# Patient Record
Sex: Male | Born: 1965 | Race: White | Hispanic: No
Health system: Western US, Academic
[De-identification: ages and names within clinical notes are randomized; demographics above are authoritative.]

## PROBLEM LIST (undated history)

## (undated) DIAGNOSIS — S065XAA Traumatic subdural hemorrhage with loss of consciousness status unknown, initial encounter: Secondary | ICD-10-CM

## (undated) DIAGNOSIS — G9600 Cerebrospinal fluid leak, unspecified: Secondary | ICD-10-CM

## (undated) DIAGNOSIS — G039 Meningitis, unspecified: Secondary | ICD-10-CM

## (undated) DIAGNOSIS — S069XAA Unspecified intracranial injury with loss of consciousness status unknown, initial encounter: Secondary | ICD-10-CM

## (undated) DIAGNOSIS — S32810A Multiple fractures of pelvis with stable disruption of pelvic ring, initial encounter for closed fracture: Secondary | ICD-10-CM

## (undated) DIAGNOSIS — R0689 Other abnormalities of breathing: Secondary | ICD-10-CM

## (undated) DIAGNOSIS — S0291XA Unspecified fracture of skull, initial encounter for closed fracture: Secondary | ICD-10-CM

## (undated) DIAGNOSIS — J189 Pneumonia, unspecified organism: Secondary | ICD-10-CM

## (undated) DIAGNOSIS — S02401A Maxillary fracture, unspecified, initial encounter for closed fracture: Secondary | ICD-10-CM

## (undated) DIAGNOSIS — S02609A Fracture of mandible, unspecified, initial encounter for closed fracture: Secondary | ICD-10-CM

## (undated) HISTORY — DX: Rider (driver) (passenger) of other motorcycle injured in unspecified traffic accident, initial encounter: V29.99XA

## (undated) HISTORY — DX: Maxillary fracture, unspecified side, initial encounter for closed fracture: S02.401A

## (undated) HISTORY — DX: Cerebrospinal fluid leak, unspecified: G96.00

## (undated) HISTORY — DX: Other abnormalities of breathing: R06.89

## (undated) HISTORY — DX: Unspecified intracranial injury with loss of consciousness status unknown, initial encounter: S06.9XAA

## (undated) HISTORY — DX: Multiple fractures of pelvis with stable disruption of pelvic ring, initial encounter for closed fracture: S32.810A

## (undated) HISTORY — DX: Unspecified fracture of skull, initial encounter for closed fracture: S02.91XA

## (undated) HISTORY — DX: Fracture of mandible, unspecified, initial encounter for closed fracture: S02.609A

## (undated) HISTORY — DX: Pneumonia, unspecified organism: J18.9

## (undated) HISTORY — DX: Traumatic subdural hemorrhage with loss of consciousness status unknown, initial encounter: S06.5XAA

## (undated) HISTORY — DX: Meningitis, unspecified: G03.9

## (undated) MED ORDER — LAMOTRIGINE 200 MG OR TABS
ORAL_TABLET | ORAL | 0 refills | Status: AC
Start: 2019-11-17 — End: ?

## (undated) MED ORDER — LAMOTRIGINE 200 MG OR TABS
ORAL_TABLET | ORAL | 0 refills | Status: AC
Start: 2019-09-06 — End: ?

---

## 2014-12-17 ENCOUNTER — Other Ambulatory Visit: Payer: Self-pay | Admitting: Unknown Physician Specialty

## 2014-12-17 ENCOUNTER — Other Ambulatory Visit: Payer: Self-pay | Admitting: Emergency Medicine

## 2014-12-17 ENCOUNTER — Inpatient Hospital Stay (HOSPITAL_BASED_OUTPATIENT_CLINIC_OR_DEPARTMENT_OTHER)
Admission: AC | Admit: 2014-12-17 | Discharge: 2015-01-03 | DRG: 003 | Disposition: A | Discharge status: Skilled Nursing Facility | Payer: Commercial Managed Care - PPO | Attending: Anesthesiology | Admitting: Anesthesiology

## 2014-12-17 ENCOUNTER — Other Ambulatory Visit: Payer: Self-pay | Admitting: Student in an Organized Health Care Education/Training Program

## 2014-12-17 ENCOUNTER — Other Ambulatory Visit: Payer: Self-pay | Admitting: Surgery

## 2014-12-17 ENCOUNTER — Inpatient Hospital Stay (HOSPITAL_COMMUNITY): Payer: BLUE CROSS/BLUE SHIELD | Admitting: Surgical Critical Care

## 2014-12-17 DIAGNOSIS — E870 Hyperosmolality and hypernatremia: Secondary | ICD-10-CM | POA: Diagnosis present

## 2014-12-17 DIAGNOSIS — S3993XA Unspecified injury of pelvis, initial encounter: Secondary | ICD-10-CM

## 2014-12-17 DIAGNOSIS — I959 Hypotension, unspecified: Secondary | ICD-10-CM | POA: Diagnosis present

## 2014-12-17 DIAGNOSIS — S01119A Laceration without foreign body of unspecified eyelid and periocular area, initial encounter: Secondary | ICD-10-CM | POA: Diagnosis present

## 2014-12-17 DIAGNOSIS — Y92410 Unspecified street and highway as the place of occurrence of the external cause: Secondary | ICD-10-CM

## 2014-12-17 DIAGNOSIS — Z66 Do not resuscitate: Secondary | ICD-10-CM | POA: Diagnosis not present

## 2014-12-17 DIAGNOSIS — G9340 Encephalopathy, unspecified: Secondary | ICD-10-CM | POA: Diagnosis not present

## 2014-12-17 DIAGNOSIS — S066X9A Traumatic subarachnoid hemorrhage with loss of consciousness of unspecified duration, initial encounter: Secondary | ICD-10-CM

## 2014-12-17 DIAGNOSIS — R6510 Systemic inflammatory response syndrome (SIRS) of non-infectious origin without acute organ dysfunction: Secondary | ICD-10-CM

## 2014-12-17 DIAGNOSIS — S299XXA Unspecified injury of thorax, initial encounter: Secondary | ICD-10-CM

## 2014-12-17 DIAGNOSIS — S0292XA Unspecified fracture of facial bones, initial encounter for closed fracture: Secondary | ICD-10-CM

## 2014-12-17 DIAGNOSIS — Z452 Encounter for adjustment and management of vascular access device: Secondary | ICD-10-CM

## 2014-12-17 DIAGNOSIS — S32810A Multiple fractures of pelvis with stable disruption of pelvic ring, initial encounter for closed fracture: Secondary | ICD-10-CM | POA: Diagnosis present

## 2014-12-17 DIAGNOSIS — E872 Acidosis: Secondary | ICD-10-CM | POA: Diagnosis present

## 2014-12-17 DIAGNOSIS — S02402A Zygomatic fracture, unspecified, initial encounter for closed fracture: Secondary | ICD-10-CM | POA: Diagnosis present

## 2014-12-17 DIAGNOSIS — E876 Hypokalemia: Secondary | ICD-10-CM | POA: Diagnosis present

## 2014-12-17 DIAGNOSIS — S0266XA Fracture of symphysis of mandible, initial encounter for closed fracture: Secondary | ICD-10-CM

## 2014-12-17 DIAGNOSIS — J155 Pneumonia due to Escherichia coli: Secondary | ICD-10-CM | POA: Diagnosis not present

## 2014-12-17 DIAGNOSIS — S02609A Fracture of mandible, unspecified, initial encounter for closed fracture: Secondary | ICD-10-CM | POA: Diagnosis present

## 2014-12-17 DIAGNOSIS — D62 Acute posthemorrhagic anemia: Secondary | ICD-10-CM

## 2014-12-17 DIAGNOSIS — R130 Aphagia: Secondary | ICD-10-CM | POA: Diagnosis present

## 2014-12-17 DIAGNOSIS — R40243 Glasgow coma scale score 3-8: Secondary | ICD-10-CM | POA: Diagnosis present

## 2014-12-17 DIAGNOSIS — G960 Cerebrospinal fluid leak: Secondary | ICD-10-CM | POA: Diagnosis present

## 2014-12-17 DIAGNOSIS — S15091A Other specified injury of right carotid artery, initial encounter: Secondary | ICD-10-CM

## 2014-12-17 DIAGNOSIS — S028XXA Fractures of other specified skull and facial bones, initial encounter for closed fracture: Secondary | ICD-10-CM | POA: Diagnosis present

## 2014-12-17 DIAGNOSIS — G008 Other bacterial meningitis: Secondary | ICD-10-CM | POA: Diagnosis not present

## 2014-12-17 DIAGNOSIS — Z431 Encounter for attention to gastrostomy: Secondary | ICD-10-CM

## 2014-12-17 DIAGNOSIS — S0219XA Other fracture of base of skull, initial encounter for closed fracture: Secondary | ICD-10-CM

## 2014-12-17 DIAGNOSIS — S065X9A Traumatic subdural hemorrhage with loss of consciousness of unspecified duration, initial encounter: Principal | ICD-10-CM | POA: Diagnosis present

## 2014-12-17 DIAGNOSIS — S065X0A Traumatic subdural hemorrhage without loss of consciousness, initial encounter: Secondary | ICD-10-CM

## 2014-12-17 DIAGNOSIS — S062X9A Diffuse traumatic brain injury with loss of consciousness of unspecified duration, initial encounter: Secondary | ICD-10-CM | POA: Insufficient documentation

## 2014-12-17 DIAGNOSIS — S329XXA Fracture of unspecified parts of lumbosacral spine and pelvis, initial encounter for closed fracture: Secondary | ICD-10-CM | POA: Diagnosis present

## 2014-12-17 DIAGNOSIS — Z4682 Encounter for fitting and adjustment of non-vascular catheter: Secondary | ICD-10-CM

## 2014-12-17 DIAGNOSIS — J95851 Ventilator associated pneumonia: Secondary | ICD-10-CM | POA: Diagnosis not present

## 2014-12-17 DIAGNOSIS — S062XAA Diffuse traumatic brain injury with loss of consciousness status unknown, initial encounter: Secondary | ICD-10-CM | POA: Insufficient documentation

## 2014-12-17 DIAGNOSIS — R633 Feeding difficulties: Secondary | ICD-10-CM

## 2014-12-17 DIAGNOSIS — S0291XA Unspecified fracture of skull, initial encounter for closed fracture: Secondary | ICD-10-CM

## 2014-12-17 DIAGNOSIS — J9601 Acute respiratory failure with hypoxia: Secondary | ICD-10-CM | POA: Diagnosis present

## 2014-12-17 DIAGNOSIS — Z7982 Long term (current) use of aspirin: Secondary | ICD-10-CM

## 2014-12-17 DIAGNOSIS — S0921XA Traumatic rupture of right ear drum, initial encounter: Secondary | ICD-10-CM

## 2014-12-17 DIAGNOSIS — S02413A LeFort III fracture, initial encounter for closed fracture: Secondary | ICD-10-CM | POA: Diagnosis present

## 2014-12-17 DIAGNOSIS — S329XXB Fracture of unspecified parts of lumbosacral spine and pelvis, initial encounter for open fracture: Secondary | ICD-10-CM

## 2014-12-17 HISTORY — DX: Other fracture of base of skull, initial encounter for closed fracture: S02.19XA

## 2014-12-17 LAB — URINALYSIS WITH REFLEX CULTURE
Bilirubin (Qual), URN: NEGATIVE
Epith Cells_Renal/Trans,URN: NEGATIVE /HPF
Glucose Qual, URN: NEGATIVE mg/dL
Ketones, URN: NEGATIVE mg/dL
Leukocyte Esterase, URN: NEGATIVE
Nitrite, URN: NEGATIVE
Specific Gravity, URN: 1.027 g/mL (ref 1.002–1.027)

## 2014-12-17 LAB — PREPARE PLASMA: Units Ordered: 6

## 2014-12-17 LAB — TRAUMA HEMORRHAGE PANEL
Alcohol (Ethyl): 154 mg/dL — AB
Anion Gap: 11 (ref 4–12)
Calcium: 7 mg/dL — ABNORMAL LOW (ref 8.9–10.2)
Carbon Dioxide, Total: 21 meq/L — ABNORMAL LOW (ref 22–32)
Chloride: 109 meq/L — ABNORMAL HIGH (ref 98–108)
Creatinine: 1.1 mg/dL (ref 0.51–1.18)
Fibrinogen: 198 mg/dL (ref 150–450)
GFR, Calc, African American: >60 mL/min (ref >59)
GFR, Calc, European American: >60 mL/min (ref >59)
Glucose: 114 mg/dL (ref 62–125)
Hematocrit: 36 % — ABNORMAL LOW (ref 38–50)
Hemoglobin: 12.5 g/dL — ABNORMAL LOW (ref 13.0–18.0)
Lipase: 23 U/L (ref <70)
MCH: 29.8 pg (ref 27.3–33.6)
MCHC: 34.5 g/dL (ref 32.2–36.5)
MCV: 86 fL (ref 81–98)
Partial Thromboplastin Time: 29 s (ref 22–35)
Platelet Count: 158 10*3/uL (ref 150–400)
Potassium: 3.9 meq/L (ref 3.6–5.2)
Prothrombin INR: 1.3 (ref 0.8–1.3)
Prothrombin Time Patient: 15.6 s (ref 10.7–15.6)
RBC: 4.2 10*6/uL — ABNORMAL LOW (ref 4.40–5.60)
RDW-CV: 13.7 % (ref 11.6–14.4)
Sodium: 141 meq/L (ref 135–145)
Urea Nitrogen: 15 mg/dL (ref 8–21)
WBC: 7.41 10*3/uL (ref 4.30–10.00)

## 2014-12-17 LAB — MAGNESIUM
Magnesium: 1.4 mg/dL — ABNORMAL LOW (ref 1.8–2.4)
Magnesium: 1.6 mg/dL — ABNORMAL LOW (ref 1.8–2.4)

## 2014-12-17 LAB — CODE PANEL, ARTERIAL, W/ LACT, ICA, NA, K, GLU, HBCO, MET HGB
Base Deficit Blood, ART: 8.9 meq/L — ABNORMAL HIGH (ref 0.0–2.0)
Bicarbonate: 17 meq/L — ABNORMAL LOW (ref 22–26)
Calcium (Ionized): 0.88 mmol/L — ABNORMAL LOW (ref 1.18–1.38)
Carboxyhemoglobin, ART: 1.2 %
Glucose: 96 mg/dL (ref 62–125)
Hematocrit: 35 % — ABNORMAL LOW (ref 38–50)
Hemoglobin: 11.2 g/dL — ABNORMAL LOW (ref 13.0–18.0)
L Lactate (Direct), ART WB: 4.4 mmol/L — ABNORMAL HIGH (ref 0.4–1.0)
Methemoglobin, ART: 1 % (ref <3.0)
O2 Content: 15.3 VOL% (ref 15–23)
O2 Sat (Frac.), ART: 96 % (ref 94–99)
O2 Sat (Func.), ART: 98 %
Potassium: 3.2 meq/L — ABNORMAL LOW (ref 3.7–5.2)
Sodium: 143 meq/L (ref 136–145)
Total Hemoglobin, ART: 11.2 g/dL — ABNORMAL LOW (ref 13.0–18.0)
pCO2, ART: 35 mmHg (ref 33–48)
pH, ART: 7.29 — ABNORMAL LOW (ref 7.35–7.45)
pO2: 117 mmHg — ABNORMAL HIGH (ref 70–95)

## 2014-12-17 LAB — BLOOD GAS, ARTERIAL, W/ LACT
Base Deficit Blood, ART: 8.7 meq/L — ABNORMAL HIGH (ref 0.0–2.0)
Base Deficit Blood, ART: 6 meq/L — ABNORMAL HIGH (ref 0.0–2.0)
Base Deficit Blood, ART: 2 meq/L (ref 0.0–2.0)
Base Deficit Blood, ART: 2.9 meq/L — ABNORMAL HIGH (ref 0.0–2.0)
Bicarbonate: 16 meq/L — ABNORMAL LOW (ref 22–26)
Bicarbonate: 18 meq/L — ABNORMAL LOW (ref 22–26)
Bicarbonate: 21 meq/L — ABNORMAL LOW (ref 22–26)
Bicarbonate: 21 meq/L — ABNORMAL LOW (ref 22–26)
Fi (O2) [%]: 100 %
Fi (O2) [%]: 100 %
Fi (O2) [%]: 60 %
Fi (O2) [%]: 10 %
L Lactate (Direct), ART WB: 5.7 mmol/L — ABNORMAL HIGH (ref 0.4–1.0)
L Lactate (Direct), ART WB: 5.1 mmol/L — ABNORMAL HIGH (ref 0.4–1.0)
L Lactate (Direct), ART WB: 2.8 mmol/L — ABNORMAL HIGH (ref 0.4–1.0)
L Lactate (Direct), ART WB: 3.9 mmol/L — ABNORMAL HIGH (ref 0.4–1.0)
PEEP (Pos End Expir Pres): 8
PEEP (Pos End Expir Pres): 10
PEEP (Pos End Expir Pres): 10
PEEP (Pos End Expir Pres): 50
pCO2, ART: 31 mmHg — ABNORMAL LOW (ref 33–48)
pCO2, ART: 31 mmHg — ABNORMAL LOW (ref 33–48)
pCO2, ART: 32 mmHg — ABNORMAL LOW (ref 33–48)
pCO2, ART: 35 mmHg (ref 33–48)
pH, ART: 7.33 — ABNORMAL LOW (ref 7.35–7.45)
pH, ART: 7.38 (ref 7.35–7.45)
pH, ART: 7.44 (ref 7.35–7.45)
pH, ART: 7.4 (ref 7.35–7.45)
pO2: 94 mmHg (ref 70–95)
pO2: 246 mmHg — ABNORMAL HIGH (ref 70–95)
pO2: 198 mmHg — ABNORMAL HIGH (ref 70–95)
pO2: 200 mmHg — ABNORMAL HIGH (ref 70–95)

## 2014-12-17 LAB — CBC (HEMOGRAM)
Hematocrit: 31 % — ABNORMAL LOW (ref 38–50)
Hemoglobin: 10.2 g/dL — ABNORMAL LOW (ref 13.0–18.0)
MCH: 28.9 pg (ref 27.3–33.6)
MCHC: 33.1 g/dL (ref 32.2–36.5)
MCV: 87 fL (ref 81–98)
Platelet Count: 108 10*3/uL — ABNORMAL LOW (ref 150–400)
RBC: 3.53 10*6/uL — ABNORMAL LOW (ref 4.40–5.60)
RDW-CV: 13.8 % (ref 11.6–14.4)
WBC: 4.25 10*3/uL — ABNORMAL LOW (ref 4.30–10.00)

## 2014-12-17 LAB — EMERGENCY HEMORRHAGE PANEL
Fibrinogen: 243 mg/dL (ref 150–450)
Fibrinogen: 370 mg/dL (ref 150–450)
Hematocrit: 26 % — ABNORMAL LOW (ref 38–50)
Hematocrit: 28 % — ABNORMAL LOW (ref 38–50)
Platelet Count: 103 10*3/uL — ABNORMAL LOW (ref 150–400)
Platelet Count: 100 10*3/uL — ABNORMAL LOW (ref 150–400)
Prothrombin INR: 1.3 (ref 0.8–1.3)
Prothrombin INR: 1.3 (ref 0.8–1.3)
Prothrombin Time Patient: 15.8 s — ABNORMAL HIGH (ref 10.7–15.6)
Prothrombin Time Patient: 15.4 s (ref 10.7–15.6)

## 2014-12-17 LAB — PREPARE PLATELETS: Units Ordered: 1

## 2014-12-17 LAB — BASIC METABOLIC PANEL
Anion Gap: 10 (ref 4–12)
Calcium: 6.2 mg/dL — ABNORMAL LOW (ref 8.9–10.2)
Carbon Dioxide, Total: 17 meq/L — ABNORMAL LOW (ref 22–32)
Chloride: 114 meq/L — ABNORMAL HIGH (ref 98–108)
Creatinine: 1.05 mg/dL (ref 0.51–1.18)
GFR, Calc, African American: >60 mL/min (ref >59)
GFR, Calc, European American: >60 mL/min (ref >59)
Glucose: 91 mg/dL (ref 62–125)
Potassium: 3.5 meq/L — ABNORMAL LOW (ref 3.6–5.2)
Sodium: 141 meq/L (ref 135–145)
Urea Nitrogen: 14 mg/dL (ref 8–21)

## 2014-12-17 LAB — BLOOD TYPE CONFIRMATION: ABO/Rh: O POS

## 2014-12-17 LAB — PROTHROMBIN & PTT
Partial Thromboplastin Time: 32 s (ref 22–35)
Prothrombin INR: 1.4 — ABNORMAL HIGH (ref 0.8–1.3)
Prothrombin Time Patient: 17 s — ABNORMAL HIGH (ref 10.7–15.6)

## 2014-12-17 LAB — TRANSFUSION SERVICES TESTING

## 2014-12-17 LAB — TYPE AND CROSSMATCH
ABO/Rh: O POS
Antibody Screen: NEGATIVE
Units Ordered: 6

## 2014-12-17 LAB — PREPARE CRYOPRECIPITATE: Units Ordered: 1

## 2014-12-17 LAB — REFLEX CULTURE FOR UA

## 2014-12-17 LAB — PHOSPHATE
Phosphate: 1.7 mg/dL — ABNORMAL LOW (ref 2.5–4.5)
Phosphate: 2.5 mg/dL (ref 2.5–4.5)

## 2014-12-17 LAB — GLUCOSE POC, HMC: Glucose (POC): 137 mg/dL — ABNORMAL HIGH (ref 62–125)

## 2014-12-17 LAB — POTASSIUM, SERUM: Potassium: 4.1 meq/L (ref 3.6–5.2)

## 2014-12-17 LAB — CREATININE, WBLD: Creatinine: 0.92 mg/dL (ref 0.51–1.18)

## 2014-12-18 LAB — BLOOD GAS, ARTERIAL (NO ELECTROLYTES)
Base Deficit Blood, ART: 1.3 meq/L (ref 0.0–2.0)
Base Deficit Blood, ART: 0.9 meq/L (ref 0.0–2.0)
Bicarbonate: 23 meq/L (ref 22–26)
Bicarbonate: 23 meq/L (ref 22–26)
Fi (O2) [%]: 50 %
Fi (O2) [%]: 40 %
PEEP (Pos End Expir Pres): 8
PEEP (Pos End Expir Pres): 5
pCO2, ART: 37 mmHg (ref 33–48)
pCO2, ART: 35 mmHg (ref 33–48)
pH, ART: 7.41 (ref 7.35–7.45)
pH, ART: 7.43 (ref 7.35–7.45)
pO2: 170 mmHg — ABNORMAL HIGH (ref 70–95)
pO2: 137 mmHg — ABNORMAL HIGH (ref 70–95)

## 2014-12-18 LAB — PROTHROMBIN & PTT
Partial Thromboplastin Time: 31 s (ref 22–35)
Prothrombin INR: 1.5 — ABNORMAL HIGH (ref 0.8–1.3)
Prothrombin Time Patient: 17.7 s — ABNORMAL HIGH (ref 10.7–15.6)

## 2014-12-18 LAB — BLOOD GAS, ARTERIAL, W/ LACT
Base Deficit Blood, ART: 0.9 meq/L (ref 0.0–2.0)
Bicarbonate: 23 meq/L (ref 22–26)
Fi (O2) [%]: 40 %
L Lactate (Direct), ART WB: 1.7 mmol/L — ABNORMAL HIGH (ref 0.4–1.0)
PEEP (Pos End Expir Pres): 5
pCO2, ART: 38 mmHg (ref 33–48)
pH, ART: 7.4 (ref 7.35–7.45)
pO2: 119 mmHg — ABNORMAL HIGH (ref 70–95)

## 2014-12-18 LAB — BASIC METABOLIC PANEL
Anion Gap: 6 (ref 4–12)
Anion Gap: 4 (ref 4–12)
Calcium: 6.9 mg/dL — ABNORMAL LOW (ref 8.9–10.2)
Calcium: 7.5 mg/dL — ABNORMAL LOW (ref 8.9–10.2)
Carbon Dioxide, Total: 21 meq/L — ABNORMAL LOW (ref 22–32)
Carbon Dioxide, Total: 23 meq/L (ref 22–32)
Chloride: 115 meq/L — ABNORMAL HIGH (ref 98–108)
Chloride: 114 meq/L — ABNORMAL HIGH (ref 98–108)
Creatinine: 0.93 mg/dL (ref 0.51–1.18)
Creatinine: 0.79 mg/dL (ref 0.51–1.18)
GFR, Calc, African American: >60 mL/min (ref >59)
GFR, Calc, African American: >60 mL/min (ref >59)
GFR, Calc, European American: >60 mL/min (ref >59)
GFR, Calc, European American: >60 mL/min (ref >59)
Glucose: 140 mg/dL — ABNORMAL HIGH (ref 62–125)
Glucose: 128 mg/dL — ABNORMAL HIGH (ref 62–125)
Potassium: 3.9 meq/L (ref 3.6–5.2)
Potassium: 3.8 meq/L (ref 3.6–5.2)
Sodium: 142 meq/L (ref 135–145)
Sodium: 141 meq/L (ref 135–145)
Urea Nitrogen: 12 mg/dL (ref 8–21)
Urea Nitrogen: 12 mg/dL (ref 8–21)

## 2014-12-18 LAB — CBC (HEMOGRAM)
Hematocrit: 26 % — ABNORMAL LOW (ref 38–50)
Hemoglobin: 8.7 g/dL — ABNORMAL LOW (ref 13.0–18.0)
MCH: 29 pg (ref 27.3–33.6)
MCHC: 33.3 g/dL (ref 32.2–36.5)
MCV: 87 fL (ref 81–98)
Platelet Count: 103 10*3/uL — ABNORMAL LOW (ref 150–400)
RBC: 3 10*6/uL — ABNORMAL LOW (ref 4.40–5.60)
RDW-CV: 14.4 % (ref 11.6–14.4)
WBC: 12.51 10*3/uL — ABNORMAL HIGH (ref 4.30–10.00)

## 2014-12-18 LAB — GLUCOSE POC, HMC: Glucose (POC): 140 mg/dL — ABNORMAL HIGH (ref 62–125)

## 2014-12-18 LAB — PHOSPHATE
Phosphate: 2.2 mg/dL — ABNORMAL LOW (ref 2.5–4.5)
Phosphate: 1.8 mg/dL — ABNORMAL LOW (ref 2.5–4.5)

## 2014-12-18 LAB — HEMATOCRIT: Hematocrit: 24 % — ABNORMAL LOW (ref 38–50)

## 2014-12-18 LAB — URINE C/S: Culture: NO GROWTH

## 2014-12-18 LAB — R/O MRSA

## 2014-12-18 LAB — MAGNESIUM: Magnesium: 2 mg/dL (ref 1.8–2.4)

## 2014-12-18 LAB — CALCIUM (IONIZED), WB: Calcium (Ionized): 1.07 mmol/L — ABNORMAL LOW (ref 1.18–1.38)

## 2014-12-18 LAB — L_LACTATE, ARTERIAL WHOLE BLOOD: L Lactate (Direct), ART WB: 2.1 mmol/L — ABNORMAL HIGH (ref 0.4–1.0)

## 2014-12-19 ENCOUNTER — Other Ambulatory Visit: Payer: Self-pay | Admitting: Surgery

## 2014-12-19 ENCOUNTER — Other Ambulatory Visit: Payer: Self-pay | Admitting: General Practice

## 2014-12-19 ENCOUNTER — Other Ambulatory Visit: Payer: Self-pay | Admitting: Neurological Surgery

## 2014-12-19 ENCOUNTER — Other Ambulatory Visit: Payer: Self-pay | Admitting: Student in an Organized Health Care Education/Training Program

## 2014-12-19 DIAGNOSIS — S32811A Multiple fractures of pelvis with unstable disruption of pelvic ring, initial encounter for closed fracture: Secondary | ICD-10-CM

## 2014-12-19 DIAGNOSIS — S028XXA Fractures of other specified skull and facial bones, initial encounter for closed fracture: Secondary | ICD-10-CM

## 2014-12-19 DIAGNOSIS — S065X0A Traumatic subdural hemorrhage without loss of consciousness, initial encounter: Secondary | ICD-10-CM

## 2014-12-19 DIAGNOSIS — S06320A Contusion and laceration of left cerebrum without loss of consciousness, initial encounter: Secondary | ICD-10-CM

## 2014-12-19 DIAGNOSIS — S06310A Contusion and laceration of right cerebrum without loss of consciousness, initial encounter: Secondary | ICD-10-CM

## 2014-12-19 DIAGNOSIS — J9690 Respiratory failure, unspecified, unspecified whether with hypoxia or hypercapnia: Secondary | ICD-10-CM

## 2014-12-19 DIAGNOSIS — S3289XA Fracture of other parts of pelvis, initial encounter for closed fracture: Secondary | ICD-10-CM

## 2014-12-19 LAB — BASIC METABOLIC PANEL
Anion Gap: 2 — ABNORMAL LOW (ref 4–12)
Anion Gap: 3 — ABNORMAL LOW (ref 4–12)
Calcium: 7.7 mg/dL — ABNORMAL LOW (ref 8.9–10.2)
Calcium: 7.6 mg/dL — ABNORMAL LOW (ref 8.9–10.2)
Carbon Dioxide, Total: 26 meq/L (ref 22–32)
Carbon Dioxide, Total: 27 meq/L (ref 22–32)
Chloride: 118 meq/L — ABNORMAL HIGH (ref 98–108)
Chloride: 113 meq/L — ABNORMAL HIGH (ref 98–108)
Creatinine: 0.79 mg/dL (ref 0.51–1.18)
Creatinine: 0.68 mg/dL (ref 0.51–1.18)
GFR, Calc, African American: >60 mL/min (ref >59)
GFR, Calc, African American: >60 mL/min (ref >59)
GFR, Calc, European American: >60 mL/min (ref >59)
GFR, Calc, European American: >60 mL/min (ref >59)
Glucose: 118 mg/dL (ref 62–125)
Glucose: 102 mg/dL (ref 62–125)
Potassium: 3.8 meq/L (ref 3.6–5.2)
Potassium: 3.6 meq/L (ref 3.6–5.2)
Sodium: 146 meq/L — ABNORMAL HIGH (ref 135–145)
Sodium: 143 meq/L (ref 135–145)
Urea Nitrogen: 11 mg/dL (ref 8–21)
Urea Nitrogen: 12 mg/dL (ref 8–21)

## 2014-12-19 LAB — CBC (HEMOGRAM)
Hematocrit: 23 % — ABNORMAL LOW (ref 38–50)
Hemoglobin: 7.6 g/dL — ABNORMAL LOW (ref 13.0–18.0)
MCH: 29.2 pg (ref 27.3–33.6)
MCHC: 33.5 g/dL (ref 32.2–36.5)
MCV: 87 fL (ref 81–98)
Platelet Count: 83 10*3/uL — ABNORMAL LOW (ref 150–400)
RBC: 2.6 10*6/uL — ABNORMAL LOW (ref 4.40–5.60)
RDW-CV: 14.6 % — ABNORMAL HIGH (ref 11.6–14.4)
WBC: 9.19 10*3/uL (ref 4.30–10.00)

## 2014-12-19 LAB — PROTHROMBIN & PTT
Partial Thromboplastin Time: 32 s (ref 22–35)
Prothrombin INR: 1.2 (ref 0.8–1.3)
Prothrombin Time Patient: 14.7 s (ref 10.7–15.6)

## 2014-12-19 LAB — R/O ACINETOBACTER CULTURE

## 2014-12-19 LAB — BLOOD GAS, ARTERIAL (NO ELECTROLYTES)
Base Excess Blood, ART: 1 meq/L (ref 0.0–3.0)
Bicarbonate: 25 meq/L (ref 22–26)
Fi (O2) [%]: 30 %
PEEP (Pos End Expir Pres): 5
pCO2, ART: 40 mmHg (ref 33–48)
pH, ART: 7.41 (ref 7.35–7.45)
pO2: 67 mmHg — ABNORMAL LOW (ref 70–95)

## 2014-12-19 LAB — CELL COUNT AND DIFF, CSF
% Basophils, Fluid: 0 %
% Eosinophils, Fluid: 0 %
% Lymphocytes, Fluid: 9 %
% Macrophages, Fluid: 10 %
% Neutrophils, Fluid: 81 %
% Unclassified Cells, Fluid: 0 %
Body Fluid Nucleated Cells: 20 {cells}/uL
No. of Cells Counted for Diff: 100 {cells}
RBC, FLD: 360 {cells}/uL

## 2014-12-19 LAB — VANCO-RESIST ENTEROCOCCUS C/S

## 2014-12-19 LAB — MAGNESIUM
Magnesium: 2 mg/dL (ref 1.8–2.4)
Magnesium: 2 mg/dL (ref 1.8–2.4)

## 2014-12-19 LAB — PHOSPHATE
Phosphate: 1.6 mg/dL — ABNORMAL LOW (ref 2.5–4.5)
Phosphate: 1.6 mg/dL — ABNORMAL LOW (ref 2.5–4.5)

## 2014-12-19 LAB — GLUCOSE, CSF: Glucose, CSF: 89 mg/dL — ABNORMAL HIGH (ref 40–80)

## 2014-12-19 LAB — HEMATOCRIT: Hematocrit: 24 % — ABNORMAL LOW (ref 38–50)

## 2014-12-19 LAB — CSF SPECIFIC TRANSFERRIN, FLD

## 2014-12-19 LAB — PROTEIN (TOTAL), CSF: Protein (Total), Csf: 21 mg/dL (ref 15–45)

## 2014-12-19 LAB — CSF C/C W/GRAM ORDER

## 2014-12-20 ENCOUNTER — Other Ambulatory Visit: Payer: Self-pay | Admitting: Nurse Practitioner

## 2014-12-20 ENCOUNTER — Other Ambulatory Visit: Payer: Self-pay | Admitting: Surgery

## 2014-12-20 ENCOUNTER — Other Ambulatory Visit: Payer: Self-pay | Admitting: General Practice

## 2014-12-20 DIAGNOSIS — S02413A LeFort III fracture, initial encounter for closed fracture: Secondary | ICD-10-CM

## 2014-12-20 DIAGNOSIS — R40244 Other coma, without documented Glasgow coma scale score, or with partial score reported: Secondary | ICD-10-CM

## 2014-12-20 DIAGNOSIS — Z515 Encounter for palliative care: Secondary | ICD-10-CM

## 2014-12-20 DIAGNOSIS — R937 Abnormal findings on diagnostic imaging of other parts of musculoskeletal system: Secondary | ICD-10-CM

## 2014-12-20 DIAGNOSIS — J9600 Acute respiratory failure, unspecified whether with hypoxia or hypercapnia: Secondary | ICD-10-CM

## 2014-12-20 DIAGNOSIS — S5012XA Contusion of left forearm, initial encounter: Secondary | ICD-10-CM

## 2014-12-20 LAB — BLOOD GAS, ARTERIAL (NO ELECTROLYTES)
Base Excess Blood, ART: 3 meq/L (ref 0.0–3.0)
Bicarbonate: 28 meq/L — ABNORMAL HIGH (ref 22–26)
Fi (O2) [%]: 40 %
PEEP (Pos End Expir Pres): 5
pCO2, ART: 45 mmHg (ref 33–48)
pH, ART: 7.4 (ref 7.35–7.45)
pO2: 66 mmHg — ABNORMAL LOW (ref 70–95)

## 2014-12-20 LAB — BASIC METABOLIC PANEL
Anion Gap: 4 (ref 4–12)
Anion Gap: 3 — ABNORMAL LOW (ref 4–12)
Calcium: 7.8 mg/dL — ABNORMAL LOW (ref 8.9–10.2)
Calcium: 7.9 mg/dL — ABNORMAL LOW (ref 8.9–10.2)
Carbon Dioxide, Total: 27 meq/L (ref 22–32)
Carbon Dioxide, Total: 30 meq/L (ref 22–32)
Chloride: 114 meq/L — ABNORMAL HIGH (ref 98–108)
Chloride: 113 meq/L — ABNORMAL HIGH (ref 98–108)
Creatinine: 0.69 mg/dL (ref 0.51–1.18)
Creatinine: 0.72 mg/dL (ref 0.51–1.18)
GFR, Calc, African American: >60 mL/min (ref >59)
GFR, Calc, African American: >60 mL/min (ref >59)
GFR, Calc, European American: >60 mL/min (ref >59)
GFR, Calc, European American: >60 mL/min (ref >59)
Glucose: 123 mg/dL (ref 62–125)
Glucose: 135 mg/dL — ABNORMAL HIGH (ref 62–125)
Potassium: 3.2 meq/L — ABNORMAL LOW (ref 3.6–5.2)
Potassium: 3.3 meq/L — ABNORMAL LOW (ref 3.6–5.2)
Sodium: 145 meq/L (ref 135–145)
Sodium: 146 meq/L — ABNORMAL HIGH (ref 135–145)
Urea Nitrogen: 13 mg/dL (ref 8–21)
Urea Nitrogen: 14 mg/dL (ref 8–21)

## 2014-12-20 LAB — CBC (HEMOGRAM)
Hematocrit: 24 % — ABNORMAL LOW (ref 38–50)
Hemoglobin: 7.8 g/dL — ABNORMAL LOW (ref 13.0–18.0)
MCH: 29.2 pg (ref 27.3–33.6)
MCHC: 32.9 g/dL (ref 32.2–36.5)
MCV: 89 fL (ref 81–98)
Platelet Count: 84 10*3/uL — ABNORMAL LOW (ref 150–400)
RBC: 2.67 10*6/uL — ABNORMAL LOW (ref 4.40–5.60)
RDW-CV: 14.4 % (ref 11.6–14.4)
WBC: 4.87 10*3/uL (ref 4.30–10.00)

## 2014-12-20 LAB — PROTHROMBIN & PTT
Partial Thromboplastin Time: 31 s (ref 22–35)
Prothrombin INR: 1.1 (ref 0.8–1.3)
Prothrombin Time Patient: 13.6 s (ref 10.7–15.6)

## 2014-12-20 LAB — MAGNESIUM: Magnesium: 1.9 mg/dL (ref 1.8–2.4)

## 2014-12-20 LAB — PHOSPHATE: Phosphate: 2.5 mg/dL (ref 2.5–4.5)

## 2014-12-20 LAB — HEMATOCRIT: Hematocrit: 24 % — ABNORMAL LOW (ref 38–50)

## 2014-12-21 DIAGNOSIS — S15001A Unspecified injury of right carotid artery, initial encounter: Secondary | ICD-10-CM

## 2014-12-21 DIAGNOSIS — S15002A Unspecified injury of left carotid artery, initial encounter: Secondary | ICD-10-CM

## 2014-12-21 LAB — BASIC METABOLIC PANEL
Anion Gap: 6 (ref 4–12)
Anion Gap: 5 (ref 4–12)
Calcium: 7.9 mg/dL — ABNORMAL LOW (ref 8.9–10.2)
Calcium: 8.2 mg/dL — ABNORMAL LOW (ref 8.9–10.2)
Carbon Dioxide, Total: 29 meq/L (ref 22–32)
Carbon Dioxide, Total: 28 meq/L (ref 22–32)
Chloride: 113 meq/L — ABNORMAL HIGH (ref 98–108)
Chloride: 110 meq/L — ABNORMAL HIGH (ref 98–108)
Creatinine: 0.66 mg/dL (ref 0.51–1.18)
Creatinine: 0.67 mg/dL (ref 0.51–1.18)
GFR, Calc, African American: >60 mL/min (ref >59)
GFR, Calc, African American: >60 mL/min (ref >59)
GFR, Calc, European American: >60 mL/min (ref >59)
GFR, Calc, European American: >60 mL/min (ref >59)
Glucose: 128 mg/dL — ABNORMAL HIGH (ref 62–125)
Glucose: 122 mg/dL (ref 62–125)
Potassium: 3.5 meq/L — ABNORMAL LOW (ref 3.6–5.2)
Potassium: 3.6 meq/L (ref 3.6–5.2)
Sodium: 148 meq/L — ABNORMAL HIGH (ref 135–145)
Sodium: 143 meq/L (ref 135–145)
Urea Nitrogen: 15 mg/dL (ref 8–21)
Urea Nitrogen: 19 mg/dL (ref 8–21)

## 2014-12-21 LAB — CBC (HEMOGRAM)
Hematocrit: 24 % — ABNORMAL LOW (ref 38–50)
Hemoglobin: 8 g/dL — ABNORMAL LOW (ref 13.0–18.0)
MCH: 29.4 pg (ref 27.3–33.6)
MCHC: 33.2 g/dL (ref 32.2–36.5)
MCV: 89 fL (ref 81–98)
Platelet Count: 96 10*3/uL — ABNORMAL LOW (ref 150–400)
RBC: 2.72 10*6/uL — ABNORMAL LOW (ref 4.40–5.60)
RDW-CV: 14.3 % (ref 11.6–14.4)
WBC: 5.53 10*3/uL (ref 4.30–10.00)

## 2014-12-21 LAB — MAGNESIUM: Magnesium: 1.8 mg/dL (ref 1.8–2.4)

## 2014-12-21 LAB — PROTHROMBIN & PTT
Partial Thromboplastin Time: 33 s (ref 22–35)
Prothrombin INR: 1 (ref 0.8–1.3)
Prothrombin Time Patient: 13.2 s (ref 10.7–15.6)

## 2014-12-21 LAB — PHOSPHATE: Phosphate: 2.2 mg/dL — ABNORMAL LOW (ref 2.5–4.5)

## 2014-12-21 LAB — C. DIFFICILE TOXIN GENE BY PCR: C. difficile Toxin Gene by PCR: NEGATIVE

## 2014-12-22 ENCOUNTER — Other Ambulatory Visit: Payer: Self-pay | Admitting: Anesthesiology

## 2014-12-22 ENCOUNTER — Other Ambulatory Visit: Payer: Self-pay | Admitting: Plastic Surgery

## 2014-12-22 ENCOUNTER — Other Ambulatory Visit: Payer: Self-pay | Admitting: Student in an Organized Health Care Education/Training Program

## 2014-12-22 ENCOUNTER — Other Ambulatory Visit: Payer: Self-pay | Admitting: Surgery

## 2014-12-22 DIAGNOSIS — S022XXA Fracture of nasal bones, initial encounter for closed fracture: Secondary | ICD-10-CM

## 2014-12-22 DIAGNOSIS — S02412A LeFort II fracture, initial encounter for closed fracture: Secondary | ICD-10-CM

## 2014-12-22 DIAGNOSIS — S02402A Zygomatic fracture, unspecified, initial encounter for closed fracture: Secondary | ICD-10-CM

## 2014-12-22 DIAGNOSIS — I6200 Nontraumatic subdural hemorrhage, unspecified: Secondary | ICD-10-CM

## 2014-12-22 DIAGNOSIS — S020XXA Fracture of vault of skull, initial encounter for closed fracture: Secondary | ICD-10-CM

## 2014-12-22 DIAGNOSIS — S0266XA Fracture of symphysis of mandible, initial encounter for closed fracture: Secondary | ICD-10-CM

## 2014-12-22 DIAGNOSIS — S023XXA Fracture of orbital floor, initial encounter for closed fracture: Secondary | ICD-10-CM

## 2014-12-22 DIAGNOSIS — S0261XA Fracture of condylar process of mandible, initial encounter for closed fracture: Secondary | ICD-10-CM

## 2014-12-22 DIAGNOSIS — S0219XA Other fracture of base of skull, initial encounter for closed fracture: Secondary | ICD-10-CM

## 2014-12-22 LAB — CBC (HEMOGRAM)
Hematocrit: 26 % — ABNORMAL LOW (ref 38–50)
Hematocrit: 27 % — ABNORMAL LOW (ref 38–50)
Hemoglobin: 8.2 g/dL — ABNORMAL LOW (ref 13.0–18.0)
Hemoglobin: 9 g/dL — ABNORMAL LOW (ref 13.0–18.0)
MCH: 28.3 pg (ref 27.3–33.6)
MCH: 28.8 pg (ref 27.3–33.6)
MCHC: 31.9 g/dL — ABNORMAL LOW (ref 32.2–36.5)
MCHC: 33.1 g/dL (ref 32.2–36.5)
MCV: 89 fL (ref 81–98)
MCV: 87 fL (ref 81–98)
Platelet Count: 122 10*3/uL — ABNORMAL LOW (ref 150–400)
Platelet Count: 159 10*3/uL (ref 150–400)
RBC: 2.9 10*6/uL — ABNORMAL LOW (ref 4.40–5.60)
RBC: 3.13 10*6/uL — ABNORMAL LOW (ref 4.40–5.60)
RDW-CV: 14.3 % (ref 11.6–14.4)
RDW-CV: 14.3 % (ref 11.6–14.4)
WBC: 6.21 10*3/uL (ref 4.30–10.00)
WBC: 6.53 10*3/uL (ref 4.30–10.00)

## 2014-12-22 LAB — BASIC METABOLIC PANEL
Anion Gap: 3 — ABNORMAL LOW (ref 4–12)
Anion Gap: 6 (ref 4–12)
Calcium: 8.3 mg/dL — ABNORMAL LOW (ref 8.9–10.2)
Calcium: 7.4 mg/dL — ABNORMAL LOW (ref 8.9–10.2)
Carbon Dioxide, Total: 30 meq/L (ref 22–32)
Carbon Dioxide, Total: 27 meq/L (ref 22–32)
Chloride: 108 meq/L (ref 98–108)
Chloride: 109 meq/L — ABNORMAL HIGH (ref 98–108)
Creatinine: 0.68 mg/dL (ref 0.51–1.18)
Creatinine: 0.67 mg/dL (ref 0.51–1.18)
GFR, Calc, African American: >60 mL/min (ref >59)
GFR, Calc, African American: >60 mL/min (ref >59)
GFR, Calc, European American: >60 mL/min (ref >59)
GFR, Calc, European American: >60 mL/min (ref >59)
Glucose: 139 mg/dL — ABNORMAL HIGH (ref 62–125)
Glucose: 122 mg/dL (ref 62–125)
Potassium: 3.4 meq/L — ABNORMAL LOW (ref 3.6–5.2)
Potassium: 4.2 meq/L (ref 3.6–5.2)
Sodium: 141 meq/L (ref 135–145)
Sodium: 142 meq/L (ref 135–145)
Urea Nitrogen: 20 mg/dL (ref 8–21)
Urea Nitrogen: 15 mg/dL (ref 8–21)

## 2014-12-22 LAB — EMERGENCY HEMORRHAGE PANEL
Fibrinogen: 680 mg/dL — ABNORMAL HIGH (ref 150–450)
Fibrinogen: 661 mg/dL — ABNORMAL HIGH (ref 150–450)
Hematocrit: 23 % — ABNORMAL LOW (ref 38–50)
Hematocrit: 24 % — ABNORMAL LOW (ref 38–50)
Platelet Count: 138 10*3/uL — ABNORMAL LOW (ref 150–400)
Platelet Count: 145 10*3/uL — ABNORMAL LOW (ref 150–400)
Prothrombin INR: 1 (ref 0.8–1.3)
Prothrombin INR: 1 (ref 0.8–1.3)
Prothrombin Time Patient: 13.1 s (ref 10.7–15.6)
Prothrombin Time Patient: 13.3 s (ref 10.7–15.6)

## 2014-12-22 LAB — BLOOD GAS, ARTERIAL (NO ELECTROLYTES)
Base Excess Blood, ART: 4.3 meq/L — ABNORMAL HIGH (ref 0.0–3.0)
Bicarbonate: 28 meq/L — ABNORMAL HIGH (ref 22–26)
Fi (O2) [%]: 50 %
PEEP (Pos End Expir Pres): 5
pCO2, ART: 40 mmHg (ref 33–48)
pH, ART: 7.46 — ABNORMAL HIGH (ref 7.35–7.45)
pO2: 104 mmHg — ABNORMAL HIGH (ref 70–95)

## 2014-12-22 LAB — PROTHROMBIN & PTT
Partial Thromboplastin Time: 27 s (ref 22–35)
Prothrombin INR: 1 (ref 0.8–1.3)
Prothrombin Time Patient: 13.3 s (ref 10.7–15.6)

## 2014-12-22 LAB — BLOOD GAS, ARTERIAL, W/ LACT
Base Excess Blood, ART: 3.3 meq/L — ABNORMAL HIGH (ref 0.0–3.0)
Bicarbonate: 27 meq/L — ABNORMAL HIGH (ref 22–26)
Fi (O2) [%]: 30 %
L Lactate (Direct), ART WB: 1.2 mmol/L — ABNORMAL HIGH (ref 0.4–1.0)
PEEP (Pos End Expir Pres): 5
pCO2, ART: 41 mmHg (ref 33–48)
pH, ART: 7.44 (ref 7.35–7.45)
pO2: 66 mmHg — ABNORMAL LOW (ref 70–95)

## 2014-12-22 LAB — TYPE AND SCREEN
ABO/Rh: O POS
Antibody Screen: NEGATIVE
Units Ordered: 1

## 2014-12-22 LAB — TRANSFUSION SERVICES TESTING

## 2014-12-23 ENCOUNTER — Other Ambulatory Visit (HOSPITAL_COMMUNITY): Payer: Self-pay

## 2014-12-23 ENCOUNTER — Other Ambulatory Visit: Payer: Self-pay | Admitting: Student in an Organized Health Care Education/Training Program

## 2014-12-23 DIAGNOSIS — I638 Other cerebral infarction: Secondary | ICD-10-CM

## 2014-12-23 DIAGNOSIS — T148 Other injury of unspecified body region: Secondary | ICD-10-CM

## 2014-12-23 DIAGNOSIS — I472 Ventricular tachycardia: Secondary | ICD-10-CM

## 2014-12-23 DIAGNOSIS — J90 Pleural effusion, not elsewhere classified: Secondary | ICD-10-CM

## 2014-12-23 HISTORY — PX: RECONSTRUCTION: SHX5224

## 2014-12-23 LAB — BASIC METABOLIC PANEL
Anion Gap: 7 (ref 4–12)
Anion Gap: 5 (ref 4–12)
Calcium: 7.5 mg/dL — ABNORMAL LOW (ref 8.9–10.2)
Calcium: 7.6 mg/dL — ABNORMAL LOW (ref 8.9–10.2)
Carbon Dioxide, Total: 27 meq/L (ref 22–32)
Carbon Dioxide, Total: 30 meq/L (ref 22–32)
Chloride: 108 meq/L (ref 98–108)
Chloride: 106 meq/L (ref 98–108)
Creatinine: 0.69 mg/dL (ref 0.51–1.18)
Creatinine: 0.69 mg/dL (ref 0.51–1.18)
GFR, Calc, African American: >60 mL/min (ref >59)
GFR, Calc, African American: >60 mL/min (ref >59)
GFR, Calc, European American: >60 mL/min (ref >59)
GFR, Calc, European American: >60 mL/min (ref >59)
Glucose: 151 mg/dL — ABNORMAL HIGH (ref 62–125)
Glucose: 154 mg/dL — ABNORMAL HIGH (ref 62–125)
Potassium: 3.6 meq/L (ref 3.6–5.2)
Potassium: 3.2 meq/L — ABNORMAL LOW (ref 3.6–5.2)
Sodium: 142 meq/L (ref 135–145)
Sodium: 141 meq/L (ref 135–145)
Urea Nitrogen: 17 mg/dL (ref 8–21)
Urea Nitrogen: 18 mg/dL (ref 8–21)

## 2014-12-23 LAB — IONIZED CALCIUM, PLASMA: Ionized Calcium, Plasma: 1.19 mmol/L (ref 1.18–1.38)

## 2014-12-23 LAB — CBC (HEMOGRAM)
Hematocrit: 25 % — ABNORMAL LOW (ref 38–50)
Hemoglobin: 8.2 g/dL — ABNORMAL LOW (ref 13.0–18.0)
MCH: 29.1 pg (ref 27.3–33.6)
MCHC: 33.1 g/dL (ref 32.2–36.5)
MCV: 88 fL (ref 81–98)
Platelet Count: 160 10*3/uL (ref 150–400)
RBC: 2.82 10*6/uL — ABNORMAL LOW (ref 4.40–5.60)
RDW-CV: 14.6 % — ABNORMAL HIGH (ref 11.6–14.4)
WBC: 6.31 10*3/uL (ref 4.30–10.00)

## 2014-12-23 LAB — BLOOD GAS, ARTERIAL (NO ELECTROLYTES)
Base Excess Blood, ART: 5.1 meq/L — ABNORMAL HIGH (ref 0.0–3.0)
Bicarbonate: 29 meq/L — ABNORMAL HIGH (ref 22–26)
Fi (O2) [%]: 30 %
PEEP (Pos End Expir Pres): 5
pCO2, ART: 42 mmHg (ref 33–48)
pH, ART: 7.45 (ref 7.35–7.45)
pO2: 77 mmHg (ref 70–95)

## 2014-12-23 LAB — MAGNESIUM: Magnesium: 1.9 mg/dL (ref 1.8–2.4)

## 2014-12-23 LAB — PREPARE PLATELETS: Units Ordered: 1

## 2014-12-23 LAB — CALCIUM, (REFLEXIVE IONIZED)

## 2014-12-23 LAB — PHOSPHATE: Phosphate: 2 mg/dL — ABNORMAL LOW (ref 2.5–4.5)

## 2014-12-24 LAB — CBC (HEMOGRAM)
Hematocrit: 23 % — ABNORMAL LOW (ref 38–50)
Hemoglobin: 7.5 g/dL — ABNORMAL LOW (ref 13.0–18.0)
MCH: 29.2 pg (ref 27.3–33.6)
MCHC: 32.5 g/dL (ref 32.2–36.5)
MCV: 90 fL (ref 81–98)
Platelet Count: 214 10*3/uL (ref 150–400)
RBC: 2.57 10*6/uL — ABNORMAL LOW (ref 4.40–5.60)
RDW-CV: 15.2 % — ABNORMAL HIGH (ref 11.6–14.4)
WBC: 7.34 10*3/uL (ref 4.30–10.00)

## 2014-12-24 LAB — BLOOD GAS, ARTERIAL (NO ELECTROLYTES)
Base Excess Blood, ART: 4.5 meq/L — ABNORMAL HIGH (ref 0.0–3.0)
Base Excess Blood, ART: 5.9 meq/L — ABNORMAL HIGH (ref 0.0–3.0)
Bicarbonate: 29 meq/L — ABNORMAL HIGH (ref 22–26)
Bicarbonate: 30 meq/L — ABNORMAL HIGH (ref 22–26)
Fi (O2) [%]: 40 %
Fi (O2) [%]: 40 %
pCO2, ART: 45 mmHg (ref 33–48)
pCO2, ART: 47 mmHg (ref 33–48)
pH, ART: 7.42 (ref 7.35–7.45)
pH, ART: 7.43 (ref 7.35–7.45)
pO2: 52 mmHg — ABNORMAL LOW (ref 70–95)
pO2: 91 mmHg (ref 70–95)

## 2014-12-24 LAB — BASIC METABOLIC PANEL
Anion Gap: 4 (ref 4–12)
Calcium: 7.7 mg/dL — ABNORMAL LOW (ref 8.9–10.2)
Carbon Dioxide, Total: 29 meq/L (ref 22–32)
Chloride: 106 meq/L (ref 98–108)
Creatinine: 0.64 mg/dL (ref 0.51–1.18)
GFR, Calc, African American: >60 mL/min (ref >59)
GFR, Calc, European American: >60 mL/min (ref >59)
Glucose: 155 mg/dL — ABNORMAL HIGH (ref 62–125)
Potassium: 3.5 meq/L — ABNORMAL LOW (ref 3.6–5.2)
Sodium: 139 meq/L (ref 135–145)
Urea Nitrogen: 18 mg/dL (ref 8–21)

## 2014-12-24 LAB — PHOSPHATE: Phosphate: 2.9 mg/dL (ref 2.5–4.5)

## 2014-12-25 LAB — CBC (HEMOGRAM)
Hematocrit: 25 % — ABNORMAL LOW (ref 38–50)
Hemoglobin: 8.2 g/dL — ABNORMAL LOW (ref 13.0–18.0)
MCH: 29.2 pg (ref 27.3–33.6)
MCHC: 32.7 g/dL (ref 32.2–36.5)
MCV: 89 fL (ref 81–98)
Platelet Count: 282 10*3/uL (ref 150–400)
RBC: 2.81 10*6/uL — ABNORMAL LOW (ref 4.40–5.60)
RDW-CV: 15 % — ABNORMAL HIGH (ref 11.6–14.4)
WBC: 8.72 10*3/uL (ref 4.30–10.00)

## 2014-12-25 LAB — BASIC METABOLIC PANEL
Anion Gap: 3 — ABNORMAL LOW (ref 4–12)
Calcium: 8.3 mg/dL — ABNORMAL LOW (ref 8.9–10.2)
Carbon Dioxide, Total: 30 meq/L (ref 22–32)
Chloride: 105 meq/L (ref 98–108)
Creatinine: 0.57 mg/dL (ref 0.51–1.18)
GFR, Calc, African American: >60 mL/min (ref >59)
GFR, Calc, European American: >60 mL/min (ref >59)
Glucose: 109 mg/dL (ref 62–125)
Potassium: 4.1 meq/L (ref 3.6–5.2)
Sodium: 138 meq/L (ref 135–145)
Urea Nitrogen: 17 mg/dL (ref 8–21)

## 2014-12-25 LAB — R/O MRSA

## 2014-12-26 LAB — R/O ACINETOBACTER CULTURE

## 2014-12-26 LAB — VITAMIN C: Vitamin C: 0.54 mg/dL (ref 0.46–1.49)

## 2014-12-26 LAB — CSF C/C W/GRAM ORDER

## 2014-12-26 LAB — BLOOD GAS PANEL 9, ART
Bicarbonate: 24 meq/L (ref 22–26)
Calcium (Ionized): 1.2 mmol/L (ref 1.18–1.38)
Carboxyhemoglobin, ART: 1.6 %
Glucose: 119 mg/dL (ref 62–125)
Hematocrit: 24 % — ABNORMAL LOW (ref 38–50)
L Lactate (Direct), ART WB: 0.6 mmol/L (ref 0.4–1.0)
Methemoglobin, ART: 0.8 % (ref <3.0)
O2 Content: 11 VOL% — ABNORMAL LOW (ref 15–23)
O2 Sat (Frac.), ART: 98 % (ref 94–99)
Potassium: 3.6 meq/L — ABNORMAL LOW (ref 3.7–5.2)
Sodium: 146 meq/L — ABNORMAL HIGH (ref 136–145)
Total Hemoglobin, ART: 7.5 g/dL — ABNORMAL LOW (ref 13.0–18.0)
pCO2, ART: 41 mmHg (ref 33–48)
pH, ART: 7.39 (ref 7.35–7.45)
pO2: 288 mmHg — ABNORMAL HIGH (ref 70–95)

## 2014-12-26 LAB — VANCO-RESIST ENTEROCOCCUS C/S

## 2014-12-26 LAB — PROTEIN (TOTAL), CSF: Protein (Total), Csf: 245 mg/dL — ABNORMAL HIGH (ref 15–45)

## 2014-12-26 LAB — CELL COUNT AND DIFF, CSF
% Basophils, Fluid: 0 %
% Eosinophils, Fluid: 1 %
% Lymphocytes, Fluid: 66 %
% Macrophages, Fluid: 23 %
% Neutrophils, Fluid: 10 %
% Unclassified Cells, Fluid: 0 %
Body Fluid Nucleated Cells: 180 {cells}/uL
No. of Cells Counted for Diff: 200 {cells}
RBC, FLD: 2600 {cells}/uL

## 2014-12-26 LAB — TRANSTHYRETIN (PRE-ALBUMIN): Transthyretin (Pre_Albumin): 11.4 mg/dL — ABNORMAL LOW (ref 19.0–45.0)

## 2014-12-26 LAB — C_REACTIVE PROTEIN: C_Reactive Protein: 89 mg/L — ABNORMAL HIGH (ref 0.0–10.0)

## 2014-12-26 LAB — GLUCOSE, CSF: Glucose, CSF: 60 mg/dL (ref 40–80)

## 2014-12-27 DIAGNOSIS — R1310 Dysphagia, unspecified: Secondary | ICD-10-CM

## 2014-12-27 LAB — CBC (HEMOGRAM)
Hematocrit: 28 % — ABNORMAL LOW (ref 38–50)
Hemoglobin: 9 g/dL — ABNORMAL LOW (ref 13.0–18.0)
MCH: 28.7 pg (ref 27.3–33.6)
MCHC: 31.8 g/dL — ABNORMAL LOW (ref 32.2–36.5)
MCV: 90 fL (ref 81–98)
Platelet Count: 446 10*3/uL — ABNORMAL HIGH (ref 150–400)
RBC: 3.14 10*6/uL — ABNORMAL LOW (ref 4.40–5.60)
RDW-CV: 15 % — ABNORMAL HIGH (ref 11.6–14.4)
WBC: 10.37 10*3/uL — ABNORMAL HIGH (ref 4.30–10.00)

## 2014-12-27 LAB — BASIC METABOLIC PANEL
Anion Gap: 5 (ref 4–12)
Calcium: 8.7 mg/dL — ABNORMAL LOW (ref 8.9–10.2)
Carbon Dioxide, Total: 32 meq/L (ref 22–32)
Chloride: 102 meq/L (ref 98–108)
Creatinine: 0.66 mg/dL (ref 0.51–1.18)
GFR, Calc, African American: >60 mL/min (ref >59)
GFR, Calc, European American: >60 mL/min (ref >59)
Glucose: 129 mg/dL — ABNORMAL HIGH (ref 62–125)
Potassium: 4.4 meq/L (ref 3.6–5.2)
Sodium: 139 meq/L (ref 135–145)
Urea Nitrogen: 18 mg/dL (ref 8–21)

## 2014-12-27 LAB — PROTHROMBIN & PTT
Partial Thromboplastin Time: 31 s (ref 22–35)
Prothrombin INR: 1 (ref 0.8–1.3)
Prothrombin Time Patient: 12.7 s (ref 10.7–15.6)

## 2014-12-28 DIAGNOSIS — S062X9A Diffuse traumatic brain injury with loss of consciousness of unspecified duration, initial encounter: Secondary | ICD-10-CM

## 2014-12-28 DIAGNOSIS — R451 Restlessness and agitation: Secondary | ICD-10-CM

## 2014-12-28 LAB — BASIC METABOLIC PANEL
Anion Gap: 5 (ref 4–12)
Calcium: 8.8 mg/dL — ABNORMAL LOW (ref 8.9–10.2)
Carbon Dioxide, Total: 31 meq/L (ref 22–32)
Chloride: 102 meq/L (ref 98–108)
Creatinine: 0.76 mg/dL (ref 0.51–1.18)
GFR, Calc, African American: >60 mL/min (ref >59)
GFR, Calc, European American: >60 mL/min (ref >59)
Glucose: 138 mg/dL — ABNORMAL HIGH (ref 62–125)
Potassium: 4 meq/L (ref 3.6–5.2)
Sodium: 138 meq/L (ref 135–145)
Urea Nitrogen: 18 mg/dL (ref 8–21)

## 2014-12-28 LAB — CBC (HEMOGRAM)
Hematocrit: 30 % — ABNORMAL LOW (ref 38–50)
Hemoglobin: 9.3 g/dL — ABNORMAL LOW (ref 13.0–18.0)
MCH: 28.8 pg (ref 27.3–33.6)
MCHC: 31.5 g/dL — ABNORMAL LOW (ref 32.2–36.5)
MCV: 91 fL (ref 81–98)
Platelet Count: 534 10*3/uL — ABNORMAL HIGH (ref 150–400)
RBC: 3.23 10*6/uL — ABNORMAL LOW (ref 4.40–5.60)
RDW-CV: 15.1 % — ABNORMAL HIGH (ref 11.6–14.4)
WBC: 14.33 10*3/uL — ABNORMAL HIGH (ref 4.30–10.00)

## 2014-12-29 ENCOUNTER — Other Ambulatory Visit: Payer: Self-pay | Admitting: Unknown Physician Specialty

## 2014-12-29 ENCOUNTER — Other Ambulatory Visit: Payer: Self-pay | Admitting: Surgical Oncology

## 2014-12-29 DIAGNOSIS — R000 Tachycardia, unspecified: Secondary | ICD-10-CM

## 2014-12-29 DIAGNOSIS — R093 Abnormal sputum: Secondary | ICD-10-CM

## 2014-12-29 DIAGNOSIS — R918 Other nonspecific abnormal finding of lung field: Secondary | ICD-10-CM

## 2014-12-29 DIAGNOSIS — D72829 Elevated white blood cell count, unspecified: Secondary | ICD-10-CM

## 2014-12-29 LAB — CBC (HEMOGRAM)
Hematocrit: 29 % — ABNORMAL LOW (ref 38–50)
Hemoglobin: 9.1 g/dL — ABNORMAL LOW (ref 13.0–18.0)
MCH: 28.5 pg (ref 27.3–33.6)
MCHC: 31 g/dL — ABNORMAL LOW (ref 32.2–36.5)
MCV: 92 fL (ref 81–98)
Platelet Count: 550 10*3/uL — ABNORMAL HIGH (ref 150–400)
RBC: 3.19 10*6/uL — ABNORMAL LOW (ref 4.40–5.60)
RDW-CV: 15.1 % — ABNORMAL HIGH (ref 11.6–14.4)
WBC: 17.7 10*3/uL — ABNORMAL HIGH (ref 4.30–10.00)

## 2014-12-30 ENCOUNTER — Other Ambulatory Visit: Payer: Self-pay | Admitting: Surgical Oncology

## 2014-12-30 ENCOUNTER — Other Ambulatory Visit: Payer: Self-pay | Admitting: Student in an Organized Health Care Education/Training Program

## 2014-12-30 DIAGNOSIS — R0989 Other specified symptoms and signs involving the circulatory and respiratory systems: Secondary | ICD-10-CM

## 2014-12-30 DIAGNOSIS — J189 Pneumonia, unspecified organism: Secondary | ICD-10-CM

## 2014-12-30 DIAGNOSIS — R0902 Hypoxemia: Secondary | ICD-10-CM

## 2014-12-30 LAB — BLOOD GAS PANEL 9, VEN
Bicarbonate, VEN: 26 meq/L (ref 23–27)
Calcium (Ionized): 1.05 mmol/L — ABNORMAL LOW (ref 1.18–1.38)
Carboxyhemoglobin, VEN: 2.9 %
Glucose: 107 mg/dL (ref 62–125)
Hematocrit: 21 % — ABNORMAL LOW (ref 38–50)
L Lactate (Direct), Venous Whole Blood: 1.2 mmol/L (ref 0.6–2.5)
Methemoglobin, VEN: 0.8 % (ref <3.0)
O2 Content, VEN: 8.1 VOL%
O2 Saturation (Frac.), VEN: 86 %
Potassium: 3.9 meq/L (ref 3.7–5.2)
Sodium: 142 meq/L (ref 136–145)
Total Hemoglobin, VEN: 6.6 g/dL — ABNORMAL LOW (ref 13.0–18.0)
pCO2, VEN: 39 mmHg — ABNORMAL LOW (ref 42–50)
pH, VEN: 7.44 — ABNORMAL HIGH (ref 7.32–7.40)
pO2, VEN: 53 mmHg — ABNORMAL HIGH (ref 35–40)

## 2014-12-30 LAB — C. DIFFICILE TOXIN GENE BY PCR: C. difficile Toxin Gene by PCR: NEGATIVE

## 2014-12-30 LAB — BASIC METABOLIC PANEL
Anion Gap: 8 (ref 4–12)
Calcium: 8.8 mg/dL — ABNORMAL LOW (ref 8.9–10.2)
Carbon Dioxide, Total: 30 meq/L (ref 22–32)
Chloride: 103 meq/L (ref 98–108)
Creatinine: 0.7 mg/dL (ref 0.51–1.18)
GFR, Calc, African American: >60 mL/min (ref >59)
GFR, Calc, European American: >60 mL/min (ref >59)
Glucose: 178 mg/dL — ABNORMAL HIGH (ref 62–125)
Potassium: 4.1 meq/L (ref 3.6–5.2)
Sodium: 141 meq/L (ref 135–145)
Urea Nitrogen: 23 mg/dL — ABNORMAL HIGH (ref 8–21)

## 2014-12-30 LAB — CBC (HEMOGRAM)
Hematocrit: 30 % — ABNORMAL LOW (ref 38–50)
Hemoglobin: 9.2 g/dL — ABNORMAL LOW (ref 13.0–18.0)
MCH: 28.5 pg (ref 27.3–33.6)
MCHC: 30.9 g/dL — ABNORMAL LOW (ref 32.2–36.5)
MCV: 92 fL (ref 81–98)
Platelet Count: 553 10*3/uL — ABNORMAL HIGH (ref 150–400)
RBC: 3.23 10*6/uL — ABNORMAL LOW (ref 4.40–5.60)
RDW-CV: 14.9 % — ABNORMAL HIGH (ref 11.6–14.4)
WBC: 15.37 10*3/uL — ABNORMAL HIGH (ref 4.30–10.00)

## 2014-12-30 LAB — CELL COUNT AND DIFF, CSF
% Basophils, Fluid: 1 %
% Eosinophils, Fluid: 0 %
% Lymphocytes, Fluid: 2 %
% Macrophages, Fluid: 25 %
% Neutrophils, Fluid: 72 %
% Unclassified Cells, Fluid: 0 %
Body Fluid Nucleated Cells: 7774 {cells}/uL
No. of Cells Counted for Diff: 100 {cells}
RBC, FLD: 59 {cells}/uL

## 2014-12-30 LAB — PREPARE PLATELETS: Units Ordered: 1

## 2014-12-30 LAB — PROTEIN (TOTAL), CSF: Protein (Total), Csf: 295 mg/dL — ABNORMAL HIGH (ref 15–45)

## 2014-12-30 LAB — CSF C/C W/GRAM ORDER

## 2014-12-30 LAB — GLUCOSE, CSF: Glucose, CSF: 67 mg/dL (ref 40–80)

## 2014-12-31 DIAGNOSIS — J9601 Acute respiratory failure with hypoxia: Secondary | ICD-10-CM

## 2014-12-31 DIAGNOSIS — G02 Meningitis in other infectious and parasitic diseases classified elsewhere: Secondary | ICD-10-CM

## 2014-12-31 DIAGNOSIS — J15211 Pneumonia due to Methicillin susceptible Staphylococcus aureus: Secondary | ICD-10-CM

## 2014-12-31 LAB — CBC (HEMOGRAM)
Hematocrit: 24 % — ABNORMAL LOW (ref 38–50)
Hemoglobin: 7.4 g/dL — ABNORMAL LOW (ref 13.0–18.0)
MCH: 28 pg (ref 27.3–33.6)
MCHC: 30.3 g/dL — ABNORMAL LOW (ref 32.2–36.5)
MCV: 92 fL (ref 81–98)
Platelet Count: 421 10*3/uL — ABNORMAL HIGH (ref 150–400)
RBC: 2.64 10*6/uL — ABNORMAL LOW (ref 4.40–5.60)
RDW-CV: 15 % — ABNORMAL HIGH (ref 11.6–14.4)
WBC: 6.07 10*3/uL (ref 4.30–10.00)

## 2014-12-31 LAB — HEMATOCRIT: Hematocrit: 25 % — ABNORMAL LOW (ref 38–50)

## 2014-12-31 LAB — URINE C/S: Culture: NO GROWTH

## 2014-12-31 LAB — VANCOMYCIN, TROUGH LEVEL: Vancomycin, Trough Level: 6.4 ug/mL (ref 5.0–20.0)

## 2015-01-01 DIAGNOSIS — R838 Other abnormal findings in cerebrospinal fluid: Secondary | ICD-10-CM

## 2015-01-01 DIAGNOSIS — R509 Fever, unspecified: Secondary | ICD-10-CM

## 2015-01-01 DIAGNOSIS — Z930 Tracheostomy status: Secondary | ICD-10-CM

## 2015-01-01 DIAGNOSIS — B9629 Other Escherichia coli [E. coli] as the cause of diseases classified elsewhere: Secondary | ICD-10-CM

## 2015-01-01 LAB — R/O MRSA

## 2015-01-01 LAB — CSF C/S W/GRAM (ANAEROBIC): Gram Smear: NONE SEEN

## 2015-01-01 LAB — BASIC METABOLIC PANEL
Anion Gap: 5 (ref 4–12)
Calcium: 8.7 mg/dL — ABNORMAL LOW (ref 8.9–10.2)
Carbon Dioxide, Total: 33 meq/L — ABNORMAL HIGH (ref 22–32)
Chloride: 107 meq/L (ref 98–108)
Creatinine: 0.63 mg/dL (ref 0.51–1.18)
GFR, Calc, African American: >60 mL/min (ref >59)
GFR, Calc, European American: >60 mL/min (ref >59)
Glucose: 142 mg/dL — ABNORMAL HIGH (ref 62–125)
Potassium: 3.7 meq/L (ref 3.6–5.2)
Sodium: 145 meq/L (ref 135–145)
Urea Nitrogen: 22 mg/dL — ABNORMAL HIGH (ref 8–21)

## 2015-01-01 LAB — LOWER RESP C/S, QUANTITATIVE
Culture: >100000
Culture: >100000
Culture: >100000

## 2015-01-01 LAB — CBC (HEMOGRAM)
Hematocrit: 26 % — ABNORMAL LOW (ref 38–50)
Hemoglobin: 7.8 g/dL — ABNORMAL LOW (ref 13.0–18.0)
MCH: 27.9 pg (ref 27.3–33.6)
MCHC: 30.5 g/dL — ABNORMAL LOW (ref 32.2–36.5)
MCV: 91 fL (ref 81–98)
Platelet Count: 451 10*3/uL — ABNORMAL HIGH (ref 150–400)
RBC: 2.8 10*6/uL — ABNORMAL LOW (ref 4.40–5.60)
RDW-CV: 14.9 % — ABNORMAL HIGH (ref 11.6–14.4)
WBC: 6.07 10*3/uL (ref 4.30–10.00)

## 2015-01-01 LAB — C. DIFFICILE TOXIN GENE BY PCR: C. difficile Toxin Gene by PCR: NEGATIVE

## 2015-01-02 ENCOUNTER — Other Ambulatory Visit: Payer: Self-pay | Admitting: Unknown Physician Specialty

## 2015-01-02 DIAGNOSIS — J159 Unspecified bacterial pneumonia: Secondary | ICD-10-CM

## 2015-01-02 DIAGNOSIS — Z4889 Encounter for other specified surgical aftercare: Secondary | ICD-10-CM

## 2015-01-02 LAB — CBC (HEMOGRAM)
Hematocrit: 26 % — ABNORMAL LOW (ref 38–50)
Hemoglobin: 8.1 g/dL — ABNORMAL LOW (ref 13.0–18.0)
MCH: 27.6 pg (ref 27.3–33.6)
MCHC: 30.7 g/dL — ABNORMAL LOW (ref 32.2–36.5)
MCV: 90 fL (ref 81–98)
Platelet Count: 435 10*3/uL — ABNORMAL HIGH (ref 150–400)
RBC: 2.94 10*6/uL — ABNORMAL LOW (ref 4.40–5.60)
RDW-CV: 14.9 % — ABNORMAL HIGH (ref 11.6–14.4)
WBC: 6.51 10*3/uL (ref 4.30–10.00)

## 2015-01-02 LAB — VANCO-RESIST ENTEROCOCCUS C/S

## 2015-01-02 LAB — R/O ACINETOBACTER CULTURE

## 2015-01-02 LAB — CSF C/S W/GRAM (ANAEROBIC)
Culture: NO GROWTH
Gram Smear: NONE SEEN

## 2015-01-03 DIAGNOSIS — G039 Meningitis, unspecified: Secondary | ICD-10-CM | POA: Insufficient documentation

## 2015-01-03 DIAGNOSIS — S069X0A Unspecified intracranial injury without loss of consciousness, initial encounter: Secondary | ICD-10-CM

## 2015-01-03 DIAGNOSIS — S02609A Fracture of mandible, unspecified, initial encounter for closed fracture: Secondary | ICD-10-CM | POA: Insufficient documentation

## 2015-01-03 DIAGNOSIS — S0291XA Unspecified fracture of skull, initial encounter for closed fracture: Secondary | ICD-10-CM | POA: Insufficient documentation

## 2015-01-03 DIAGNOSIS — S065X9A Traumatic subdural hemorrhage with loss of consciousness of unspecified duration, initial encounter: Secondary | ICD-10-CM | POA: Insufficient documentation

## 2015-01-03 DIAGNOSIS — S0219XA Other fracture of base of skull, initial encounter for closed fracture: Secondary | ICD-10-CM | POA: Insufficient documentation

## 2015-01-03 DIAGNOSIS — S0292XA Unspecified fracture of facial bones, initial encounter for closed fracture: Secondary | ICD-10-CM

## 2015-01-03 DIAGNOSIS — G9600 Cerebrospinal fluid leak, unspecified: Secondary | ICD-10-CM | POA: Insufficient documentation

## 2015-01-03 DIAGNOSIS — S02413A LeFort III fracture, initial encounter for closed fracture: Secondary | ICD-10-CM | POA: Insufficient documentation

## 2015-01-03 DIAGNOSIS — S065XAA Traumatic subdural hemorrhage with loss of consciousness status unknown, initial encounter: Secondary | ICD-10-CM | POA: Insufficient documentation

## 2015-01-03 LAB — CBC (HEMOGRAM)
Hematocrit: 27 % — ABNORMAL LOW (ref 38–50)
Hemoglobin: 8.3 g/dL — ABNORMAL LOW (ref 13.0–18.0)
MCH: 27.7 pg (ref 27.3–33.6)
MCHC: 30.5 g/dL — ABNORMAL LOW (ref 32.2–36.5)
MCV: 91 fL (ref 81–98)
Platelet Count: 452 10*3/uL — ABNORMAL HIGH (ref 150–400)
RBC: 3 10*6/uL — ABNORMAL LOW (ref 4.40–5.60)
RDW-CV: 15.2 % — ABNORMAL HIGH (ref 11.6–14.4)
WBC: 7.79 10*3/uL (ref 4.30–10.00)

## 2015-01-03 LAB — BASIC METABOLIC PANEL
Anion Gap: 4 (ref 4–12)
Calcium: 8.8 mg/dL — ABNORMAL LOW (ref 8.9–10.2)
Carbon Dioxide, Total: 33 meq/L — ABNORMAL HIGH (ref 22–32)
Chloride: 105 meq/L (ref 98–108)
Creatinine: 0.64 mg/dL (ref 0.51–1.18)
GFR, Calc, African American: >60 mL/min (ref >59)
GFR, Calc, European American: >60 mL/min (ref >59)
Glucose: 134 mg/dL — ABNORMAL HIGH (ref 62–125)
Potassium: 4.1 meq/L (ref 3.6–5.2)
Sodium: 142 meq/L (ref 135–145)
Urea Nitrogen: 19 mg/dL (ref 8–21)

## 2015-01-03 LAB — BLOOD C/S: Culture: NO GROWTH

## 2015-01-04 DIAGNOSIS — J95822 Acute and chronic postprocedural respiratory failure: Secondary | ICD-10-CM | POA: Insufficient documentation

## 2015-01-09 LAB — CSF C/S W/GRAM (ANAEROBIC)
Culture: NO GROWTH
Gram Smear: NONE SEEN

## 2015-01-18 ENCOUNTER — Ambulatory Visit (HOSPITAL_BASED_OUTPATIENT_CLINIC_OR_DEPARTMENT_OTHER): Payer: Commercial Managed Care - PPO | Attending: Plastic Surgery | Admitting: Plastic Surgery

## 2015-01-18 ENCOUNTER — Encounter (HOSPITAL_BASED_OUTPATIENT_CLINIC_OR_DEPARTMENT_OTHER): Payer: Commercial Managed Care - PPO

## 2015-01-18 ENCOUNTER — Encounter (HOSPITAL_BASED_OUTPATIENT_CLINIC_OR_DEPARTMENT_OTHER): Payer: Self-pay | Admitting: Plastic Surgery

## 2015-01-18 VITALS — BP 126/76 | HR 87 | Temp 98.6°F

## 2015-01-18 DIAGNOSIS — S0292XD Unspecified fracture of facial bones, subsequent encounter for fracture with routine healing: Secondary | ICD-10-CM | POA: Insufficient documentation

## 2015-01-18 NOTE — Progress Notes (Signed)
I saw and evaluated the patient and agree with Dr. Brown's note.
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
## 2015-01-18 NOTE — Progress Notes (Signed)
Sherlene ShamsMichael James Jacquet accompanied by daughter and ambulance crew, [facility] [place] [phone]  Work/School Status: not response to verbal, arrived on stretcher, not working      Patient has a right arm PICC, Trached on oxygen 3 l/m, and fed though a G tube    Mood:restless  Diplopia: not able to assess  Blurry vision:  Not able to assess  Malocclusion: not able to assess, though arch bars are in placed, no elastics  Stuffy nose: not able to assess, though no indication of occlussion  Decreased sensation to  Not able to assess  Diet: through G tube      Healed coronal incision, Nasal splint was removed by patient.  Preoperative paperwork completed. Planning sheet filled out.   Plan to perform: arch bar removal  Length of procedure: 30 min  Length of hospital stay: outpatient  Patient reports last dose of blood thinner- enoxaparin .   NSAIDS were last used-not taking  [facility]operative teaching not done[facility].      Recovery time: none      Arch bars/elastics: in elastics, arch bars in place    Scar massage  Eye lubrication  Cleaning scalp    Post Education response:  Daughter stated understanding
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
## 2015-01-27 ENCOUNTER — Other Ambulatory Visit (HOSPITAL_BASED_OUTPATIENT_CLINIC_OR_DEPARTMENT_OTHER): Payer: Self-pay

## 2015-01-27 DIAGNOSIS — S32810A Multiple fractures of pelvis with stable disruption of pelvic ring, initial encounter for closed fracture: Secondary | ICD-10-CM

## 2015-02-01 ENCOUNTER — Ambulatory Visit (HOSPITAL_BASED_OUTPATIENT_CLINIC_OR_DEPARTMENT_OTHER): Payer: Commercial Managed Care - PPO

## 2015-02-14 ENCOUNTER — Encounter (HOSPITAL_BASED_OUTPATIENT_CLINIC_OR_DEPARTMENT_OTHER): Payer: Commercial Managed Care - PPO | Admitting: Audiologist

## 2015-02-14 ENCOUNTER — Telehealth (HOSPITAL_BASED_OUTPATIENT_CLINIC_OR_DEPARTMENT_OTHER): Payer: Self-pay

## 2015-02-14 ENCOUNTER — Ambulatory Visit (HOSPITAL_BASED_OUTPATIENT_CLINIC_OR_DEPARTMENT_OTHER): Payer: Commercial Managed Care - PPO | Attending: Otology & Neurotology | Admitting: Otology & Neurotology

## 2015-02-14 VITALS — BP 132/81 | HR 81

## 2015-02-14 DIAGNOSIS — S0219XD Other fracture of base of skull, subsequent encounter for fracture with routine healing: Secondary | ICD-10-CM | POA: Insufficient documentation

## 2015-02-14 NOTE — Telephone Encounter (Signed)
I called Cody Mccormick back and let her know we could try to get patient checked in once he is done with spine and would try to get him out of the hallway as soon as possible.  This will also be communicated to the charge RN.  Jacqualin CombesJenny Hudson Majkowski, RN Ortho Trauma Clinic
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
## 2015-02-14 NOTE — Telephone Encounter (Signed)
CONFIRMED PHONE NUMBER:   CALLERS FIRST AND LAST NAME: Christina  F[facility] TITLE: Secretary  CALLERS RELATIONSHIP:OTHER: Inpatient hospital  RETURN CALL: Detailed message on voicemail only     SUBJECT: General Message   REASON FOR REQUEST: Waiting room    MESSAGE: Patient is coming to three appointments tomorrow, about an hour gap between his appointment with Neurosurgery and his appointment at Ortho Trauma, Trula OreChristina is hoping there is someplace he can wait,  since he will be on a stretcher and becomes agitated if left out in a hallway. Please call to advise.

## 2015-02-15 ENCOUNTER — Other Ambulatory Visit (HOSPITAL_BASED_OUTPATIENT_CLINIC_OR_DEPARTMENT_OTHER): Payer: Self-pay

## 2015-02-15 ENCOUNTER — Other Ambulatory Visit: Payer: Self-pay | Admitting: Neurological Surgery

## 2015-02-15 ENCOUNTER — Ambulatory Visit (HOSPITAL_BASED_OUTPATIENT_CLINIC_OR_DEPARTMENT_OTHER): Payer: Commercial Managed Care - PPO | Attending: Neurological Surgery

## 2015-02-15 ENCOUNTER — Ambulatory Visit (HOSPITAL_BASED_OUTPATIENT_CLINIC_OR_DEPARTMENT_OTHER): Payer: Commercial Managed Care - PPO | Admitting: Physician Assistant

## 2015-02-15 ENCOUNTER — Ambulatory Visit: Payer: Commercial Managed Care - PPO

## 2015-02-15 DIAGNOSIS — Z87820 Personal history of traumatic brain injury: Secondary | ICD-10-CM

## 2015-02-15 DIAGNOSIS — S32810D Multiple fractures of pelvis with stable disruption of pelvic ring, subsequent encounter for fracture with routine healing: Secondary | ICD-10-CM | POA: Insufficient documentation

## 2015-02-15 DIAGNOSIS — S065X0D Traumatic subdural hemorrhage without loss of consciousness, subsequent encounter: Secondary | ICD-10-CM | POA: Insufficient documentation

## 2015-02-15 DIAGNOSIS — S32810A Multiple fractures of pelvis with stable disruption of pelvic ring, initial encounter for closed fracture: Secondary | ICD-10-CM

## 2015-02-15 DIAGNOSIS — S3289XD Fracture of other parts of pelvis, subsequent encounter for fracture with routine healing: Secondary | ICD-10-CM

## 2015-02-15 DIAGNOSIS — I6200 Nontraumatic subdural hemorrhage, unspecified: Secondary | ICD-10-CM

## 2015-02-15 NOTE — Patient Instructions (Signed)
Your Post-operative/ Injury Progress:    Patient Concerns:  Patient voiced the following concerns: Family wants to know when he can progress activities to the lower extremity  Overall, your progress appears good.  Your incision appears to be healing well.    X-rays are not taken at your first postoperative visit.     We obtained xrays today. The x-rays show your fracture is/are  healing well.      Physical Therapy:  1. We discussed the need to initiate Physical Therapy (PT)    2. Please work on range of motion of your injured extremity     3. You may weight bear as tolerated the bilateral lower extremities and no high impact activities for the next 3 months    4.  Elevating above your heart will help swelling to your extremity, compression with socks or stockinette can help.    Pain Management:  We discussed pain management.  I've recommended the following:  continue current medication regimen unchanged 6 weeks after your surgery, we can  no longer prescribe narcotics and we recommend that you discontinue the use of anything but Tylenol.     Please discuss your continued narcotic needs with your primary care provider.  If you do not have one, please contact your insurance to see who was covered by your plan.       Incision Care:  For incision care, we discussed the following:    1. Monitor your incisions or wounds for signs of infection such as redness and drainage  2. Once your sutures or staples are removed you may shower but do not submerse in a pool or bath until all your wounds are completely healed.        Follow-up:  Please schedule a follow-up visit in 3 months .    1. Avoid NSAIDs (Aleve, Advil, Motrin, Ibuprofen) until you are instructed that it is ok which is typically not for at least the first 6-12 weeks. If you are not healing well or a smoker this can be a longer time.  2. Please take adequate Vitamin D and Calcium which can be prescribed by your primary care provider or purchased from your local  pharmacy (see dose recommendations below)  3.  It is important to follow the weight bearing restrictions reviewed with you today.  4. Smoking even one cigarette a day  has been studied and shown to slow fracture healing .     Please contact us if you have any questions or concerns that still need addressing.       Dannial MonarchMichelle Zyniah Ferraiolo MS, [place]PA-C  [phone]  [phone] desk  Pager [phone]     Recommended Dietary Allowances (RDAs) for Vitamin D [1]    0-12 months* 400 IU       1-13 years 600 IU        14-18 years 600 IU    19-50 years 600 IU    51-70 years 600 IU        >70 years 800 IU       Calcium Recommended Dietary Allowance (mg/day)    114 - 48 years old  1,38000   99 - 981 years old 1,26300   3214 - 70179 years old 1,57300   7519 - 48 years old 1,63000  5031 - 48 years old 1,7000  7051 - 48 years old males 1,61000  8851 18- 48 year old females 591,18200   4971+ years old 1,200     114 - 18 years  old, pregnant/lactating 1,66   62 - 61 years old, pregnant/lactating 1,000
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
## 2015-02-23 ENCOUNTER — Telehealth (HOSPITAL_BASED_OUTPATIENT_CLINIC_OR_DEPARTMENT_OTHER): Payer: Self-pay | Admitting: Plastic Surgery

## 2015-02-23 NOTE — Telephone Encounter (Signed)
CONFIRMED PHONE NUMBER: [phone]  CALLERS FIRST AND LAST NAME: Sherlene ShamsMichael James Mccormick  FACILITY NAME: na TITLE: na  CALLERS RELATIONSHIP:Self  RETURN CALL: OK to leave detailed message with anyone that answers       SUBJECT: General Message   REASON FOR REQUEST: Appointment    MESSAGE: Trula OreChristina from [facility] called checking to see when the patients next appointment is. Trula OreChristina believed it to be on 03/01/15 for removal or arch bars in mouth. CCR confirmed that there was no outpatient appointment scheduled. Please advise, thank you.

## 2015-02-24 NOTE — Telephone Encounter (Signed)
Would nursing advise, don't know if to schedule with nursing, or provider.    notes from last visit 4.27.2016   Will need arch bars removed in 2-6 weeks and will book for this today in clinic (DID NOT HAPPEN)   Follow-up in clinic in 6 weeks to see [name] or earlier if any concerns arise especially in regards to R eye irritation.  Thanks -[employer]

## 2015-02-27 NOTE — Telephone Encounter (Signed)
[facility],  is [facility] regarding scheduling the patient a follow up appointment to have the arch bars removed from his mouth. The patient has pulled one out and the hospital has put restraining mitts on the patient to prevent any more form being pulled out. Please advise as soon as possible. Alternative number is the main nursing unit at [phone].

## 2015-03-01 ENCOUNTER — Encounter (HOSPITAL_BASED_OUTPATIENT_CLINIC_OR_DEPARTMENT_OTHER): Payer: Self-pay

## 2015-03-01 ENCOUNTER — Ambulatory Visit (HOSPITAL_BASED_OUTPATIENT_CLINIC_OR_DEPARTMENT_OTHER): Payer: Commercial Managed Care - PPO | Attending: Plastic Surgery | Admitting: Plastic Surgery

## 2015-03-01 VITALS — BP 117/75 | HR 89 | Temp 98.6°F

## 2015-03-01 DIAGNOSIS — S0993XS Unspecified injury of face, sequela: Secondary | ICD-10-CM | POA: Insufficient documentation

## 2015-03-01 DIAGNOSIS — Z4889 Encounter for other specified surgical aftercare: Secondary | ICD-10-CM

## 2015-03-01 NOTE — Progress Notes (Signed)
This note was dictated by Azlee Monforte Salomo, MD.
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
## 2015-03-01 NOTE — Progress Notes (Signed)
Cody ShamsMichael James Mccormick [facility]  Here with daughter.  Need to discuss/plan arch bar removal.  Work/School Status: still in rehab hospital.  Patient verbal with full sentences.  Repeats self.  In WC and capped trach.    Preoperative paperwork completed. Planning sheet filled out.   Plan to perform: removal of arch bars.  Length of procedure: 15 minutes  Length of hospital stay: outpatient  Patient reports last dose of blood thinner- nightly lovenox  NSAIDS were last used-none  Preoperative teaching done.         Using teach back method, education topics included at this visit:  Arch bars      Post Education response: Family able to repeat all information.

## 2015-03-01 NOTE — Patient Instructions (Addendum)
Thank you for coming to clinic today.  This is AVS for family and [facility] for orders.  Your wound care is:   Please provide oral care at least 2 times a day.  Please wash hair at least twice a week with normal shampoo.  Massage to help remove scabs and scalp buildup.      Surgery has been scheduled for 04-09-15    Preoperative paperwork completed. Planning sheet filled out.   Plan to perform: removal of arch bars.  Length of procedure: 15 minutes  Length of hospital stay: outpatient  Patient reports last dose of blood thinner- nightly lovenox  NSAIDS were last used-none  Preoperative teaching done.     Expect to need oral care after arch bar removal.            If you have any concerns regarding your healing, wounds, pain or infection please call the clinic.   Our recommendation is that you do not work or drive if you are taking narcotic pain medicine.   Please be aware medication refills may take up to three business days.   If you need to schedule or change a follow up appointment, please call [phone].

## 2015-03-03 ENCOUNTER — Ambulatory Visit (HOSPITAL_COMMUNITY): Payer: Commercial Managed Care - PPO | Admitting: Plastic Surgery

## 2015-03-03 ENCOUNTER — Ambulatory Visit (HOSPITAL_BASED_OUTPATIENT_CLINIC_OR_DEPARTMENT_OTHER)
Admission: RE | Admit: 2015-03-03 | Discharge: 2015-03-03 | Disposition: A | Discharge status: Home / Self Care | Payer: Commercial Managed Care - PPO | Attending: Plastic Surgery | Admitting: Plastic Surgery

## 2015-03-03 DIAGNOSIS — X58XXXD Exposure to other specified factors, subsequent encounter: Secondary | ICD-10-CM | POA: Insufficient documentation

## 2015-03-03 DIAGNOSIS — S0292XD Unspecified fracture of facial bones, subsequent encounter for fracture with routine healing: Secondary | ICD-10-CM | POA: Insufficient documentation

## 2015-03-22 ENCOUNTER — Encounter (HOSPITAL_BASED_OUTPATIENT_CLINIC_OR_DEPARTMENT_OTHER): Payer: Self-pay

## 2015-04-11 NOTE — Progress Notes (Signed)
Administrative chart closure  This encounter was opened in error

## 2015-04-24 ENCOUNTER — Telehealth (HOSPITAL_BASED_OUTPATIENT_CLINIC_OR_DEPARTMENT_OTHER): Payer: Self-pay

## 2015-04-24 NOTE — Telephone Encounter (Signed)
(  TEXTING IS AN OPTION FOR [facility] CLINICS ONLY)  Is this a [facility] clinic? No      RETURN CALL: Detailed message on voicemail only      SUBJECT:  General Message     REASON FOR REQUEST: Surgical follow up questions    MESSAGE: Patients daughter, Cody Mccormick, called with some questions.  She states that the patients eye lashes are now growing on the inside of his eye lid and that is causing irritation to his eye.  They would like to know how this could be fixed.  Please call to discuss.    Thanks!

## 2015-04-25 NOTE — Telephone Encounter (Signed)
Patient had arch bars removed in June, never had follow up here as appointment was cancelled.   Call to daughter and advised we should see in clinic.   Patient is now at home, ambulatory. Was at SNF at previous visits.   Scheduled in to see Dr. Hulan Fess tomorrow.

## 2015-04-26 ENCOUNTER — Ambulatory Visit (HOSPITAL_BASED_OUTPATIENT_CLINIC_OR_DEPARTMENT_OTHER): Payer: Commercial Managed Care - PPO

## 2015-04-26 ENCOUNTER — Encounter (HOSPITAL_BASED_OUTPATIENT_CLINIC_OR_DEPARTMENT_OTHER): Payer: Self-pay

## 2015-04-26 VITALS — BP 115/79 | HR 72 | Temp 97.7°F | Wt 200.4 lb

## 2015-04-26 DIAGNOSIS — H02003 Unspecified entropion of right eye, unspecified eyelid: Secondary | ICD-10-CM | POA: Insufficient documentation

## 2015-04-26 NOTE — Patient Instructions (Addendum)
Thank you for coming to clinic today.     Dr. Hulan Fess discussed that the inside of your eye, right may need to be released by oculoplastics, Amadi and after that  When things settle down you may need to have part of your eye socket rebuilt if needed.  We will put in  A referral to Dr Elmarie Mainland.     Continue using eye lubricant, especially at nighttime to ensure they eyeball is wet. This may also help the eyelashes from brushing the eye.     If you have any concerns regarding your healing, wounds, pain or infection please call the clinic.     If you need to schedule or change a follow up appointment, please call [phone].
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
## 2015-05-01 ENCOUNTER — Ambulatory Visit (HOSPITAL_BASED_OUTPATIENT_CLINIC_OR_DEPARTMENT_OTHER): Payer: Commercial Managed Care - PPO | Attending: Ophthalmology | Admitting: Ophthalmology

## 2015-05-01 DIAGNOSIS — H02003 Unspecified entropion of right eye, unspecified eyelid: Secondary | ICD-10-CM | POA: Insufficient documentation

## 2015-05-01 NOTE — Progress Notes (Signed)
External photos obtained in the office today, both eyes, and images stored in OIS.

## 2015-05-01 NOTE — Patient Instructions (Signed)
Thank you for coming in today.  During today's visit we reviewed only your ophthalmology (eye-related) medications.  Please follow up with your primary care provider for any questions regarding other medications.    If your eyes were dilated during today's visit, the average dilation will last 4 to 6 hours and may impact your overall vision during this period. Please use caution during this period.    If you need to schedule or change a follow up appointment, please call [phone].  Our phone lines are open 7:00am to 8:00pm Monday through Saturdays and 9:00am to 5:30pm on Sundays.

## 2015-05-01 NOTE — Progress Notes (Signed)
I have seen and examined the patient. I have reviewed and agree with the history and exam. I discussed the ROS/PFSH with the patient.    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 Cristal Deer B. Deretha Emory, MD
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
## 2015-05-02 ENCOUNTER — Other Ambulatory Visit (HOSPITAL_BASED_OUTPATIENT_CLINIC_OR_DEPARTMENT_OTHER): Payer: Self-pay | Admitting: Ophthalmology

## 2015-05-02 DIAGNOSIS — H02003 Unspecified entropion of right eye, unspecified eyelid: Secondary | ICD-10-CM

## 2015-05-05 ENCOUNTER — Ambulatory Visit (HOSPITAL_BASED_OUTPATIENT_CLINIC_OR_DEPARTMENT_OTHER)
Admission: RE | Admit: 2015-05-05 | Discharge: 2015-05-05 | Disposition: A | Discharge status: Home / Self Care | Payer: Commercial Managed Care - PPO | Attending: Ophthalmology | Admitting: Ophthalmology

## 2015-05-05 ENCOUNTER — Ambulatory Visit (HOSPITAL_BASED_OUTPATIENT_CLINIC_OR_DEPARTMENT_OTHER): Payer: BLUE CROSS/BLUE SHIELD | Admitting: Ophthalmology

## 2015-05-05 ENCOUNTER — Ambulatory Visit: Payer: Self-pay

## 2015-05-05 DIAGNOSIS — H02102 Unspecified ectropion of right lower eyelid: Secondary | ICD-10-CM

## 2015-05-05 DIAGNOSIS — H02532 Eyelid retraction right lower eyelid: Secondary | ICD-10-CM | POA: Insufficient documentation

## 2015-05-05 DIAGNOSIS — H02003 Unspecified entropion of right eye, unspecified eyelid: Secondary | ICD-10-CM

## 2015-05-05 DIAGNOSIS — Z87891 Personal history of nicotine dependence: Secondary | ICD-10-CM | POA: Insufficient documentation

## 2015-05-05 DIAGNOSIS — H5711 Ocular pain, right eye: Secondary | ICD-10-CM | POA: Insufficient documentation

## 2015-05-05 HISTORY — PX: SURGICAL HX OTHER: 99

## 2015-05-05 NOTE — Telephone Encounter (Signed)
-----   Message from [place] Mccormick sent at 05/05/2015 10:25 PM PDT -----  Regarding: Given a mouthwash solution for procedure and accidentally swallowed it rather than spitting it out  >> Cody Mccormick 05/05/2015 10:25 PM  Given a mouthwash solution for procedure and accidentally swallowed it rather than spitting it out

## 2015-05-05 NOTE — Telephone Encounter (Signed)
Wife Tiffany states pt is supposed to use "Magic mouthwash"-  Pt swallowed a big mouthful 20 minutes ago  Denies any symptoms.  States she thinks he did not understand he was supposed to spit it out.  Kathlene November from [facility] conferenced with wife [name]   He states medicine des contain-  Numbing agent- avoid foods that he could choke on  May be a little more drowsy from the benadryl  Maalox will help with any upset stomach.    Number for poison center given.

## 2015-05-05 NOTE — Telephone Encounter (Signed)
Protocol: POISONING-ADULT-AH  Affirmative: HARMLESS SUBSTANCE (non-poisonous) ingestion (all triage questions negative)  Disposition of Home Care suggested.

## 2015-05-09 ENCOUNTER — Telehealth (HOSPITAL_BASED_OUTPATIENT_CLINIC_OR_DEPARTMENT_OTHER): Payer: Self-pay | Admitting: Ophthalmology

## 2015-05-09 DIAGNOSIS — H02003 Unspecified entropion of right eye, unspecified eyelid: Secondary | ICD-10-CM

## 2015-05-09 NOTE — Telephone Encounter (Signed)
(  TEXTING IS AN OPTION FOR [facility]IN[facility] ONLY)  Is this a [facility] clinic? No      RETURN CALL: General message OK      SUBJECT:  General Message     REASON FOR REQUEST: Medial quesiton    MESSAGE: Patient keeps rubbing the surgery area. Tiffany would like to know if it be better to remove the stitches so patient is not bothered by them. Please Advise. Thank you

## 2015-05-10 MED ORDER — NEOMYCIN-POLYMYXIN-DEXAMETH 0.1 % OP OINT
1.0000 | TOPICAL_OINTMENT | Freq: Four times a day (QID) | OPHTHALMIC | Status: DC
Start: 2015-05-10 — End: 2015-12-06

## 2015-05-10 NOTE — Addendum Note (Signed)
Addended by: Karrie Doffing on: 05/10/2015 10:41 AM     Modules accepted: Orders

## 2015-05-10 NOTE — Telephone Encounter (Signed)
The daughter is noticing a colored discharge coming from the corner of his eye.  She is concerned about this.  Routed to Dr. Deretha Emory and Dr. Christella Hartigan to advise.    PROCEDURES:  DOS: 05/05/15  1.   Lower eyelid retraction repair with hard palate mucosal graft, right side.    2.   Eyelid ectropion repair with lateral tarsal strip, right eye.    3.   Frost suture, right eye.      I told her that I would speak to the MD tomorrow and call back.

## 2015-05-10 NOTE — Telephone Encounter (Addendum)
I spoke to Dr. Deretha Emory.  He states that is OK to take the stitches out as able.  The patient will return to clinic next Monday.    Per caregiver request and MD authorization, maxitrol ointment reordered.

## 2015-05-11 ENCOUNTER — Telehealth (HOSPITAL_BASED_OUTPATIENT_CLINIC_OR_DEPARTMENT_OTHER): Payer: Self-pay | Admitting: Ophthalmology

## 2015-05-11 ENCOUNTER — Other Ambulatory Visit (HOSPITAL_BASED_OUTPATIENT_CLINIC_OR_DEPARTMENT_OTHER): Payer: Self-pay | Admitting: Ophthalmology

## 2015-05-11 DIAGNOSIS — H019 Unspecified inflammation of eyelid: Secondary | ICD-10-CM

## 2015-05-11 MED ORDER — CLINDAMYCIN HCL 150 MG OR CAPS
450.0000 mg | ORAL_CAPSULE | Freq: Three times a day (TID) | ORAL | Status: AC
Start: 2015-05-11 — End: 2015-05-18

## 2015-05-11 NOTE — Telephone Encounter (Signed)
Dr. Christella Hartigan called the daughter this AM.

## 2015-05-11 NOTE — Telephone Encounter (Signed)
Called and spoke with patient's daughter, Cody Mccormick.  She says they took the sutures out yesterday at home because he just wouldn't stop rubbing them.  The lid is swollen nearly closed, and the eye looks injected. There is a green-yellow thick discharge ("more than normal morning mattering, but not white or smelly like pus") from the right eye.  Offered to see the patient today.  As they live far away, instead we have agreed to a trial of oral antibiotics (sent Clinda prescription  PO q8h x 7d to their preferred [facility]), and warm compresses, and continued Maxitrol ointment.  Gave her my pager number in the event that it worsens by tomorrow, or fails to start improving by Saturday.  Plan to keep the pre-set appointment for Monday unless he needs to be seen sooner PRN.   She agrees with this plan.

## 2015-05-11 NOTE — Telephone Encounter (Signed)
The patient is calling again this AM and she feels the the patient has an infection.  Routed to Dr. Christella Hartigan and to Dr. Deretha Emory to please call the daughter, [name], this morning.  I did offer to have the patient come in but she would prefer a phone call for now.

## 2015-05-15 ENCOUNTER — Ambulatory Visit (HOSPITAL_BASED_OUTPATIENT_CLINIC_OR_DEPARTMENT_OTHER): Payer: Commercial Managed Care - PPO | Attending: Ophthalmology | Admitting: Ophthalmology

## 2015-05-15 DIAGNOSIS — H02003 Unspecified entropion of right eye, unspecified eyelid: Secondary | ICD-10-CM | POA: Insufficient documentation

## 2015-05-15 DIAGNOSIS — H02533 Eyelid retraction right eye, unspecified eyelid: Secondary | ICD-10-CM | POA: Insufficient documentation

## 2015-05-15 NOTE — Progress Notes (Signed)
I have seen and examined the patient. I have reviewed and agree with the history and exam. I discussed the ROS/PFSH with the patient.    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

## 2015-05-15 NOTE — Progress Notes (Signed)
External photo done OU    In clinic External photo done

## 2015-05-15 NOTE — Patient Instructions (Signed)
Thank you for coming in today.  During today's visit we reviewed only your ophthalmology (eye-related) medications.  Please follow up with your primary care provider for any questions regarding other medications.    If your eyes were dilated during today's visit, the average dilation will last 4 to 6 hours and may impact your overall vision during this period. Please use caution during this period.    If you need to schedule or change a follow up appointment, please call [phone].  Our phone lines are open 7:00am to 8:00pm Monday through Saturdays and 9:00am to 5:30pm on Sundays.

## 2015-05-15 NOTE — Progress Notes (Signed)
S: 25M s/p RLL ectropion repair with hard palate mucosal graft and LTS (05/05/15). Required Frost stitch removal due to patient rubbing it. Some expression of yellow fluid at home and started on oral antibiotic. Still using erythromycin ointment too. Bothersome vicryl sutures in RLL, but otherwise doing well.    O:  Eyes: See eye exam. RLL in good position with healthy appearing mucosal graft. No erythema or discharge. Hard palate donor site is healing well.    A/P:  Progressing well after RLL ectropion repair.  - Finish oral antibiotic and antibiotic ointment  - Transition to ointment without antibiotic when finished with antibiotic ointment  - RTC in 1-2 months

## 2015-05-17 NOTE — Progress Notes (Signed)
I saw and evaluated the patient and agree with the report above.

## 2015-05-24 ENCOUNTER — Other Ambulatory Visit (HOSPITAL_BASED_OUTPATIENT_CLINIC_OR_DEPARTMENT_OTHER): Payer: Self-pay

## 2015-05-24 ENCOUNTER — Ambulatory Visit (HOSPITAL_BASED_OUTPATIENT_CLINIC_OR_DEPARTMENT_OTHER): Payer: Commercial Managed Care - PPO | Attending: Physician Assistant | Admitting: Orthopaedic Surgery

## 2015-05-24 ENCOUNTER — Encounter (HOSPITAL_BASED_OUTPATIENT_CLINIC_OR_DEPARTMENT_OTHER): Payer: Self-pay

## 2015-05-24 VITALS — BP 116/88 | HR 67 | Ht 71.0 in | Wt 200.0 lb

## 2015-05-24 DIAGNOSIS — S32810D Multiple fractures of pelvis with stable disruption of pelvic ring, subsequent encounter for fracture with routine healing: Secondary | ICD-10-CM

## 2015-05-24 DIAGNOSIS — S32810A Multiple fractures of pelvis with stable disruption of pelvic ring, initial encounter for closed fracture: Secondary | ICD-10-CM
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
## 2015-10-02 ENCOUNTER — Telehealth (HOSPITAL_BASED_OUTPATIENT_CLINIC_OR_DEPARTMENT_OTHER): Payer: Self-pay
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
## 2015-10-11 ENCOUNTER — Ambulatory Visit (HOSPITAL_BASED_OUTPATIENT_CLINIC_OR_DEPARTMENT_OTHER): Payer: No Typology Code available for payment source | Attending: Emergency Medicine

## 2015-10-11 ENCOUNTER — Other Ambulatory Visit (HOSPITAL_BASED_OUTPATIENT_CLINIC_OR_DEPARTMENT_OTHER): Payer: Self-pay

## 2015-10-11 ENCOUNTER — Encounter (HOSPITAL_BASED_OUTPATIENT_CLINIC_OR_DEPARTMENT_OTHER): Payer: Self-pay

## 2015-10-11 ENCOUNTER — Ambulatory Visit (HOSPITAL_BASED_OUTPATIENT_CLINIC_OR_DEPARTMENT_OTHER): Payer: No Typology Code available for payment source

## 2015-10-11 VITALS — BP 121/77 | HR 67 | Temp 97.9°F | Wt 203.0 lb

## 2015-10-11 DIAGNOSIS — Z4889 Encounter for other specified surgical aftercare: Secondary | ICD-10-CM

## 2015-10-11 DIAGNOSIS — Z8781 Personal history of (healed) traumatic fracture: Secondary | ICD-10-CM

## 2015-10-11 DIAGNOSIS — Z01818 Encounter for other preprocedural examination: Secondary | ICD-10-CM | POA: Insufficient documentation

## 2015-10-11 NOTE — Patient Instructions (Addendum)
Thank you for coming to clinic today.       Dr. Hulan Fess discussed the  Plates in your mouth are pointing our since your teeth were removed on the left side in the back and we would recommend removing it.  We can surgically take out the plates. We will also reposition the implants around your right eye, and if needed place a new one.    Before surgery:  1) Please follow the shower instructions and use the wipes given to you  2) Nothing to eat at 6 hours before surgery,   a.  you may drink water up to 2 hours before surgery.  3) If  You have any questions about your surgical procedure, please call the clinic to talk with a nurse at [phone].    If you are unable to keep your surgery appointment call the Patient Care Coordinator at [phone], Monday through Friday, one or more days before your surgery date.    On the Day of your  Surgery:  1)  If you are having  Same Day Surgery, you must have a ride home, as you will be unable to drive. Your transportation must be arranged prior to the day of your procedure.    a.  You may take a taxi, but only if a friend or family member accompanies you.  2)  Don't wear jewelry including piercings on the day of your procedure.  3)  Remove dentures, eyewear, glasses or contact lenses before your procedure.  4)  Bring a copy of your identification and insurance card.  5)  Bring a method of payment for any co-pays you may have.  6)  Bring any special equipment that you will need after surgery (ex. crutches, wheelchairs, CPAP machine, etc.).  7)  Wear loose fitting clothing that is easy to put on and take off and will fit over a splint or bulky dressing.    Directions to check-in morning of your procedure:  BY CAR: Park in garage P2. The garage entrance is on [address], between [employer] and [employer]. Take the parking garage elevators to G level. Follow the signs to the [facility] Building.     BY BUS: Main routes 3, 4, & 60 - Child psychotherapist Building from [employer].     BY  TAXI: A Patient Drop-Off zone is located next to the building on the [place] bound lane of [address] between [employer] and [employer]. Psychologist, sport and exercise the [employer] Building  from [address]     INSIDE THE [facility] BUILDING:  Take the elevator to the Ground (G) level in the [employer]. Enter the Surgical Waiting Room and go to the check-in desk.     A.  The address for the [facility] Building is [address],[place]  [address]   B.  The [facility] building is across [address] from [facility].      After surgery:  1) it will be done as an outpatient procedure, you will go home the same day.       For you missing teeth, follow up with your dentist.  An orthodontist would help with your upper teeth, which would require braces to help correct it.    If you have any concerns regarding your healing, wounds, pain or infection please call the clinic.     If you need to schedule or change a follow up appointment, please call [phone].
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
## 2015-10-13 ENCOUNTER — Inpatient Hospital Stay (HOSPITAL_BASED_OUTPATIENT_CLINIC_OR_DEPARTMENT_OTHER)
Admission: RE | Admit: 2015-10-13 | Discharge: 2015-10-14 | DRG: 320 | Disposition: A | Discharge status: Home / Self Care | Payer: No Typology Code available for payment source

## 2015-10-13 ENCOUNTER — Inpatient Hospital Stay (HOSPITAL_COMMUNITY): Payer: No Typology Code available for payment source

## 2015-10-13 ENCOUNTER — Other Ambulatory Visit: Payer: Self-pay | Admitting: Plastic Surgery

## 2015-10-13 DIAGNOSIS — R9431 Abnormal electrocardiogram [ECG] [EKG]: Secondary | ICD-10-CM

## 2015-10-13 DIAGNOSIS — S02413A LeFort III fracture, initial encounter for closed fracture: Secondary | ICD-10-CM

## 2015-10-13 DIAGNOSIS — T8484XA Pain due to internal orthopedic prosthetic devices, implants and grafts, initial encounter: Principal | ICD-10-CM | POA: Diagnosis present

## 2015-10-13 DIAGNOSIS — S0240CS Maxillary fracture, right side, sequela: Secondary | ICD-10-CM

## 2015-10-13 DIAGNOSIS — H05401 Unspecified enophthalmos, right eye: Secondary | ICD-10-CM

## 2015-10-13 DIAGNOSIS — Z87898 Personal history of other specified conditions: Secondary | ICD-10-CM

## 2015-10-13 DIAGNOSIS — Z87820 Personal history of traumatic brain injury: Secondary | ICD-10-CM

## 2015-10-13 HISTORY — PX: SURGICAL HX OTHER: 99

## 2015-10-13 LAB — COMPREHENSIVE METABOLIC PANEL
ALT (GPT): 23 U/L (ref 10–64)
AST (GOT): 14 U/L (ref 9–38)
Albumin: 4.1 g/dL (ref 3.5–5.2)
Alkaline Phosphatase (Total): 72 U/L (ref 39–139)
Anion Gap: 7 (ref 4–12)
Bilirubin (Total): 0.6 mg/dL (ref 0.2–1.3)
Calcium: 9.2 mg/dL (ref 8.9–10.2)
Carbon Dioxide, Total: 30 meq/L (ref 22–32)
Chloride: 103 meq/L (ref 98–108)
Creatinine: 0.98 mg/dL (ref 0.51–1.18)
GFR, Calc, African American: >60 mL/min/{1.73_m2}
GFR, Calc, European American: >60 mL/min/{1.73_m2}
Glucose: 119 mg/dL (ref 62–125)
Potassium: 4.1 meq/L (ref 3.6–5.2)
Protein (Total): 6.5 g/dL (ref 6.0–8.2)
Sodium: 140 meq/L (ref 135–145)
Urea Nitrogen: 12 mg/dL (ref 8–21)

## 2015-10-13 LAB — CBC (HEMOGRAM)
Hematocrit: 40 % (ref 38–50)
Hemoglobin: 13.7 g/dL (ref 13.0–18.0)
MCH: 28.4 pg (ref 27.3–33.6)
MCHC: 34 g/dL (ref 32.2–36.5)
MCV: 84 fL (ref 81–98)
Platelet Count: 140 10*3/uL — ABNORMAL LOW (ref 150–400)
RBC: 4.82 10*6/uL (ref 4.40–5.60)
RDW-CV: 13.6 % (ref 11.6–14.4)
WBC: 11.51 10*3/uL — ABNORMAL HIGH (ref 4.30–10.00)

## 2015-10-13 LAB — R/O MRSA

## 2015-10-14 ENCOUNTER — Ambulatory Visit: Payer: Self-pay

## 2015-10-14 NOTE — Telephone Encounter (Signed)
-----   Message from Chase Caller sent at 10/14/2015  6:01 PM PST -----  Regarding: [facility] Pt- Was discharged, and wants to know when he was given tylenol and ibuprofen.   >> SUZRA MARTHA ANTOINE 10/14/2015 06:01 PM  [facility] Pt- Was discharged, and wants to know when he was given tylenol and ibuprofen.

## 2015-10-14 NOTE — Telephone Encounter (Signed)
Protocol: MEDICATION QUESTION CALL-ADULT-AH  Affirmative: Caller has medication question only, adult not sick, and triager answers question  Disposition of Home Care: Information or Advice Only Call suggested.

## 2015-10-14 NOTE — Telephone Encounter (Signed)
Daughter wanted to know when the last doses of tylenol and ibuprofen were given.  Verified for her.  No other questions.

## 2015-10-15 ENCOUNTER — Ambulatory Visit: Payer: Self-pay

## 2015-10-15 NOTE — Telephone Encounter (Signed)
-----   Message from Chase Caller sent at 10/15/2015  3:27 PM PST -----  Regarding: [facility] Pt- Cap wasn't on the mouth rinse. Wants a refill sent to pharmacy   >> SUZRA MARTHA ANTOINE 10/15/2015 03:27 PM  [facility] Pt- Cap wasn't on the mouth rinse. Wants a refill sent to pharmacy

## 2015-10-15 NOTE — Telephone Encounter (Signed)
Protocol: NO CONTACT OR DUPLICATE CONTACT CALL-ADULT-AH  Affirmative: Message left on identified answering machine.  Disposition of No Contact Calls  suggested.

## 2015-10-16 ENCOUNTER — Other Ambulatory Visit (HOSPITAL_BASED_OUTPATIENT_CLINIC_OR_DEPARTMENT_OTHER): Payer: Self-pay | Admitting: Student in an Organized Health Care Education/Training Program

## 2015-10-16 DIAGNOSIS — Z8781 Personal history of (healed) traumatic fracture: Secondary | ICD-10-CM

## 2015-10-16 MED ORDER — CHLORHEXIDINE GLUCONATE 0.12 % MT SOLN
15.0000 mL | Freq: Three times a day (TID) | OROMUCOSAL | Status: DC
Start: 2015-10-16 — End: 2015-12-06

## 2015-10-16 NOTE — Telephone Encounter (Signed)
Call to patient, reached his daughter. States the cap was not on bottle at discharge so mouthwash leaked out and now they have none.   Post op from 1/20 s/p removal of painful palpable hardware, bilateral zygoma and maxilla, exploration right orbit, repositioning of orbital implant and reconstruction of residual orbital blowout defects  , Dr. Hulan Fess patient.   Will provide info to resident to send in [place] to [facility].   No other questions per daughter.

## 2015-10-18 ENCOUNTER — Ambulatory Visit: Payer: Self-pay

## 2015-10-18 NOTE — Telephone Encounter (Signed)
Protocol: POST-OP SYMPTOMS AND QUESTIONS-ADULT-AH  Affirmative: Caller has URGENT question and triager unable to answer question  Disposition of Call PCP Now suggested.
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
## 2015-10-18 NOTE — Telephone Encounter (Signed)
Protocol: VISION LOSS OR CHANGE-ADULT-AH  Affirmative: [1] Blurred vision or visual changes AND [2] present now AND [3] sudden onset or new (e.g., minutes, hours, days)  (Exception: previously diagnosed migraine headaches with same symptoms)  Disposition of Go to ED Now (or PCP Triage) suggested.

## 2015-10-18 NOTE — Telephone Encounter (Signed)
-----   Message from Chase Caller sent at 10/18/2015  5:57 PM PST -----  Regarding: CB: Hasn't heard back from clinic. [facility] PT- Had surgery on eye, and states good eye is getting blurry  >> Cody Mccormick 10/18/2015 05:57 PM  CB: Hasn't heard back from clinic. [facility] PT- Had surgery on eye, and states good eye is getting blurry in vision

## 2015-10-18 NOTE — Telephone Encounter (Signed)
-----   Message from Chase Caller sent at 10/18/2015  3:17 PM PST -----  Regarding: [facility] PT- Had surgery on eye, and states good eye is getting blurry in vision   >> Cody Mccormick 10/18/2015 03:17 PM  [facility] PT- Had surgery on eye, and states good eye is getting blurry in vision

## 2015-10-19 ENCOUNTER — Telehealth (HOSPITAL_BASED_OUTPATIENT_CLINIC_OR_DEPARTMENT_OTHER): Payer: Self-pay

## 2015-10-19 NOTE — Telephone Encounter (Addendum)
Opened in error

## 2015-10-19 NOTE — Telephone Encounter (Signed)
Spoke with Cody Mccormick and informed her that the blurry vision is not related to the surgery performed by the Cranio/Plastic MD's  And thanked her for following through on taking [place] to see an OpthomologistI  The Opthomologist agreed that the blurry vision is not normal and suggested a procedure (?OCT) to hopefully explain the blurred vision  I instructed Cody Mccormick to return to see this Opthomologist to do do this test since [facility] cannot order a test from an outside facility   I told Cody Mccormick that she can call us once she has the results of any test done on Cody Mccormick  Or she could take Cody Mccormick to be seen by another Opthomologist  I also reminded Cody Mccormick the [place] has an appointment with [place] on 10/25/2015 and she stated she was aware of this  [name] stated "good understanding of this plan and agrees with it.   No further questions or concerns stated by [facility]

## 2015-10-25 ENCOUNTER — Ambulatory Visit (HOSPITAL_BASED_OUTPATIENT_CLINIC_OR_DEPARTMENT_OTHER)
Payer: No Typology Code available for payment source | Attending: Plastic and Reconstructive Surgery | Admitting: Plastic and Reconstructive Surgery

## 2015-10-25 ENCOUNTER — Encounter (HOSPITAL_BASED_OUTPATIENT_CLINIC_OR_DEPARTMENT_OTHER): Payer: Self-pay | Admitting: Plastic and Reconstructive Surgery

## 2015-10-25 VITALS — BP 116/48 | HR 66 | Temp 98.6°F | Wt 204.0 lb

## 2015-10-25 DIAGNOSIS — S0180XA Unspecified open wound of other part of head, initial encounter: Secondary | ICD-10-CM | POA: Insufficient documentation

## 2015-10-25 LAB — WOUND CULTURE W/GRAM ORDER
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
## 2015-10-25 NOTE — Progress Notes (Signed)
Cody Mccormick accompanied by father  Surgery with hard ware removal  Work/School Status: not working out side of family.    Pain medications/refill needs:  Off pain meds  Using oral rinse three times a day.  Mood: ok per patient and father  Diplopia: slightly off.  Seen by optho yesterday with good results.  Blurry vision: denies  Washing face gently.    Hopper MD gently probed an area of concern on the right lower chin and culture was taken.  Bacitracin or other topical ointment to be used for one week.      Using teach back method, education topics included at this visit:  Activity restrictions.  Post op craniofacial care  Scar massage  Pain/medications    Post Education response: father able to repeat all information.  Patient asks father for reinforcement and guidance.

## 2015-10-25 NOTE — Patient Instructions (Addendum)
Thank you for coming to clinic today.   Your wound care is: continue to wash face daily.  Activity restriction: no straining or heavy lifting for 6-8 weeks more.  Stop mouth wash.  After washing face please but bacitracin or some other topical ointment on the chin where Dr. Alwyn Ren removed an ingrown type hair.  Do this for no more than a week    If you have any concerns regarding your healing, wounds, pain or infection please call the clinic.   Our recommendation is that you do not work or drive if you are taking narcotic pain medicine.   Please be aware medication refills may take up to three business days.   If you need to schedule or change a follow up appointment, please call [phone].

## 2015-10-27 LAB — WOUND C/S W/GRAM

## 2015-10-30 ENCOUNTER — Telehealth (HOSPITAL_BASED_OUTPATIENT_CLINIC_OR_DEPARTMENT_OTHER): Payer: Self-pay

## 2015-10-30 NOTE — Telephone Encounter (Signed)
Phoned [phone], no answer, left message requesting Kennie to call [phone] to follow up on his wound healing

## 2015-11-06 ENCOUNTER — Inpatient Hospital Stay (HOSPITAL_COMMUNITY): Payer: No Typology Code available for payment source | Admitting: Neurology

## 2015-11-06 ENCOUNTER — Inpatient Hospital Stay (HOSPITAL_BASED_OUTPATIENT_CLINIC_OR_DEPARTMENT_OTHER)
Admission: EM | Admit: 2015-11-06 | Discharge: 2015-11-08 | DRG: 053 | Disposition: A | Discharge status: Home / Self Care | Payer: No Typology Code available for payment source | Source: Other Acute Inpatient Hospital | Attending: Neurology | Admitting: Neurology

## 2015-11-06 ENCOUNTER — Encounter (HOSPITAL_BASED_OUTPATIENT_CLINIC_OR_DEPARTMENT_OTHER): Payer: Self-pay | Admitting: Pediatrics

## 2015-11-06 ENCOUNTER — Other Ambulatory Visit: Payer: Self-pay | Admitting: Emergency Medicine

## 2015-11-06 DIAGNOSIS — R55 Syncope and collapse: Secondary | ICD-10-CM

## 2015-11-06 DIAGNOSIS — G40901 Epilepsy, unspecified, not intractable, with status epilepticus: Principal | ICD-10-CM | POA: Diagnosis present

## 2015-11-06 DIAGNOSIS — Z87898 Personal history of other specified conditions: Secondary | ICD-10-CM

## 2015-11-06 DIAGNOSIS — Z87820 Personal history of traumatic brain injury: Secondary | ICD-10-CM

## 2015-11-06 DIAGNOSIS — E876 Hypokalemia: Secondary | ICD-10-CM | POA: Diagnosis not present

## 2015-11-06 DIAGNOSIS — R569 Unspecified convulsions: Secondary | ICD-10-CM

## 2015-11-06 DIAGNOSIS — Z452 Encounter for adjustment and management of vascular access device: Secondary | ICD-10-CM

## 2015-11-06 LAB — BLOOD GAS, ARTERIAL (NO ELECTROLYTES)
Base Excess Blood, ART: 2 meq/L (ref 0.0–3.0)
Bicarbonate: 25 meq/L (ref 22–26)
Fi (O2) [%]: 60 %
PEEP (Pos End Expir Pres): 5
pCO2, ART: 36 mmHg (ref 33–48)
pH, ART: 7.46 — ABNORMAL HIGH (ref 7.35–7.45)
pO2: 254 mmHg — ABNORMAL HIGH (ref 70–95)

## 2015-11-06 LAB — CBC, DIFF
% Basophils: 0 %
% Eosinophils: 0 %
% Immature Granulocytes: 0 %
% Lymphocytes: 16 %
% Monocytes: 12 %
% Neutrophils: 72 %
Absolute Eosinophil Count: 0.02 10*3/uL (ref 0.00–0.50)
Absolute Lymphocyte Count: 1.19 10*3/uL (ref 1.00–4.80)
Basophils: 0.01 10*3/uL (ref 0.00–0.20)
Hematocrit: 35 % — ABNORMAL LOW (ref 38–50)
Hemoglobin: 12 g/dL — ABNORMAL LOW (ref 13.0–18.0)
Immature Granulocytes: 0.01 10*3/uL (ref 0.00–0.05)
MCH: 28.8 pg (ref 27.3–33.6)
MCHC: 34.4 g/dL (ref 32.2–36.5)
MCV: 84 fL (ref 81–98)
Monocytes: 0.83 10*3/uL — ABNORMAL HIGH (ref 0.00–0.80)
Neutrophils: 5.18 10*3/uL (ref 1.80–7.00)
Platelet Count: 130 10*3/uL — ABNORMAL LOW (ref 150–400)
RBC: 4.17 10*6/uL — ABNORMAL LOW (ref 4.40–5.60)
RDW-CV: 13.3 % (ref 11.6–14.4)
WBC: 7.24 10*3/uL (ref 4.30–10.00)

## 2015-11-06 LAB — URINALYSIS COMPLETE, URN
Bacteria, URN: NONE SEEN
Bilirubin (Qual), URN: NEGATIVE
Epith Cells_Renal/Trans,URN: NEGATIVE /HPF
Epith Cells_Squamous, URN: NEGATIVE /LPF
Glucose Qual, URN: NEGATIVE mg/dL
Ketones, URN: NEGATIVE mg/dL
Leukocyte Esterase, URN: NEGATIVE
Nitrite, URN: NEGATIVE
Occult Blood, URN: NEGATIVE
Protein (Alb Semiquant), URN: NEGATIVE mg/dL
RBC, URN: NEGATIVE /HPF
Specific Gravity, URN: 1.019 g/mL (ref 1.002–1.027)
WBC, URN: NEGATIVE /HPF
pH, URN: 7.5 (ref 5.0–8.0)

## 2015-11-06 LAB — STANDARD DRUG SCREEN, URN
Acetaminophen Qualitative, URN: NEGATIVE
Alcohol (Ethyl), URN: NEGATIVE mg/dL
Amphet/Methamphetamine Qual,URN: NEGATIVE
Barbiturate (Qual), URN: NEGATIVE
Benzodiazepines (Qual), URN: POSITIVE — AB
Cannabinoids (Qual), URN: NEGATIVE
Cocaine (Qual), URN: NEGATIVE
Methadone (Qual), URN: NEGATIVE
Opiates (Qual), URN: NEGATIVE
Phencyclidine (Qual), URN: NEGATIVE
Tricyclic Antidepressants, URN: NEGATIVE

## 2015-11-06 LAB — PROTHROMBIN & PTT
Partial Thromboplastin Time: 31 s (ref 22–35)
Prothrombin INR: 1.1 (ref 0.8–1.3)
Prothrombin Time Patient: 13.7 s (ref 10.7–15.6)

## 2015-11-06 LAB — COMPREHENSIVE METABOLIC PANEL
ALT (GPT): 18 U/L (ref 10–64)
AST (GOT): 19 U/L (ref 9–38)
Albumin: 3.5 g/dL (ref 3.5–5.2)
Alkaline Phosphatase (Total): 55 U/L (ref 39–139)
Anion Gap: 7 (ref 4–12)
Bilirubin (Total): 0.6 mg/dL (ref 0.2–1.3)
Calcium: 8.6 mg/dL — ABNORMAL LOW (ref 8.9–10.2)
Carbon Dioxide, Total: 24 meq/L (ref 22–32)
Chloride: 109 meq/L — ABNORMAL HIGH (ref 98–108)
Creatinine: 0.77 mg/dL (ref 0.51–1.18)
GFR, Calc, African American: >60 mL/min/{1.73_m2}
GFR, Calc, European American: >60 mL/min/{1.73_m2}
Glucose: 101 mg/dL (ref 62–125)
Potassium: 3.8 meq/L (ref 3.6–5.2)
Protein (Total): 5.5 g/dL — ABNORMAL LOW (ref 6.0–8.2)
Sodium: 140 meq/L (ref 135–145)
Urea Nitrogen: 18 mg/dL (ref 8–21)

## 2015-11-06 LAB — L LACTATE, VENOUS WB (TO U/H LAB WITHIN 30 MIN): L Lactate (Direct), Venous Whole Blood: 0.8 mmol/L (ref 0.6–1.9)

## 2015-11-06 LAB — TROPONIN_I
Troponin_I Interpretation: NORMAL
Troponin_I: <0.03 ng/mL (ref <0.04)

## 2015-11-07 DIAGNOSIS — J9620 Acute and chronic respiratory failure, unspecified whether with hypoxia or hypercapnia: Secondary | ICD-10-CM

## 2015-11-07 DIAGNOSIS — R569 Unspecified convulsions: Secondary | ICD-10-CM

## 2015-11-07 DIAGNOSIS — G40901 Epilepsy, unspecified, not intractable, with status epilepticus: Secondary | ICD-10-CM

## 2015-11-07 LAB — CBC (HEMOGRAM)
Hematocrit: 36 % — ABNORMAL LOW (ref 38–50)
Hemoglobin: 12.3 g/dL — ABNORMAL LOW (ref 13.0–18.0)
MCH: 28.2 pg (ref 27.3–33.6)
MCHC: 34 g/dL (ref 32.2–36.5)
MCV: 83 fL (ref 81–98)
Platelet Count: 134 10*3/uL — ABNORMAL LOW (ref 150–400)
RBC: 4.36 10*6/uL — ABNORMAL LOW (ref 4.40–5.60)
RDW-CV: 13.5 % (ref 11.6–14.4)
WBC: 5.43 10*3/uL (ref 4.30–10.00)

## 2015-11-07 LAB — BASIC METABOLIC PANEL
Anion Gap: 7 (ref 4–12)
Calcium: 8.8 mg/dL — ABNORMAL LOW (ref 8.9–10.2)
Carbon Dioxide, Total: 24 meq/L (ref 22–32)
Chloride: 111 meq/L — ABNORMAL HIGH (ref 98–108)
Creatinine: 0.79 mg/dL (ref 0.51–1.18)
GFR, Calc, African American: >60 mL/min/{1.73_m2}
GFR, Calc, European American: >60 mL/min/{1.73_m2}
Glucose: 85 mg/dL (ref 62–125)
Potassium: 3.3 meq/L — ABNORMAL LOW (ref 3.6–5.2)
Sodium: 142 meq/L (ref 135–145)
Urea Nitrogen: 14 mg/dL (ref 8–21)

## 2015-11-07 LAB — CALCIUM, (REFLEXIVE IONIZED)

## 2015-11-08 LAB — R/O MRSA

## 2015-11-08 LAB — PHENYTOIN, FREE AND TOTAL
Phenytoin (Dilantin): 14.1 ug/mL (ref 10.0–20.0)
Phenytoin (Free): 1.5 ug/mL (ref 1.0–2.0)
Phenytoin, Free/Total Ratio: 11 % (ref 8–14)

## 2015-11-08 LAB — VANCO-RESIST ENTEROCOCCUS C/S

## 2015-11-09 LAB — R/O ACINETOBACTER CULTURE

## 2015-11-10 ENCOUNTER — Telehealth (HOSPITAL_BASED_OUTPATIENT_CLINIC_OR_DEPARTMENT_OTHER): Payer: Self-pay | Admitting: Neurology

## 2015-11-10 NOTE — Telephone Encounter (Signed)
Spoke with caller; patient scheduled.

## 2015-11-10 NOTE — Telephone Encounter (Signed)
(  TEXTING IS AN OPTION FOR [facility] CLINICS ONLY)  Is this a [facility] clinic? No      RETURN CALL: Detailed message on voicemail only      SUBJECT:  General Message     REASON FOR REQUEST: Referral    MESSAGE: Patients daughter called in regarding her fathers [facility] Neuro appointment per the referral, she states she is returning a call, there is not an appointment available for me to schedule with the urgency of this referral within 1-3 weeks. What pops up for me is May. Please reach out to the patients daughter ASAP. Thank you.

## 2015-11-22 ENCOUNTER — Encounter (HOSPITAL_BASED_OUTPATIENT_CLINIC_OR_DEPARTMENT_OTHER): Payer: No Typology Code available for payment source

## 2015-11-29 ENCOUNTER — Encounter (HOSPITAL_BASED_OUTPATIENT_CLINIC_OR_DEPARTMENT_OTHER): Payer: No Typology Code available for payment source

## 2015-12-06 ENCOUNTER — Ambulatory Visit (HOSPITAL_BASED_OUTPATIENT_CLINIC_OR_DEPARTMENT_OTHER): Payer: No Typology Code available for payment source | Attending: Physician Assistant | Admitting: Nurse Practitioner

## 2015-12-06 ENCOUNTER — Telehealth (HOSPITAL_BASED_OUTPATIENT_CLINIC_OR_DEPARTMENT_OTHER): Payer: Self-pay | Admitting: Nurse Practitioner

## 2015-12-06 ENCOUNTER — Ambulatory Visit (HOSPITAL_BASED_OUTPATIENT_CLINIC_OR_DEPARTMENT_OTHER): Payer: No Typology Code available for payment source | Admitting: Orthopaedic Surgery

## 2015-12-06 ENCOUNTER — Ambulatory Visit: Payer: Self-pay

## 2015-12-06 ENCOUNTER — Other Ambulatory Visit (HOSPITAL_BASED_OUTPATIENT_CLINIC_OR_DEPARTMENT_OTHER): Payer: Self-pay

## 2015-12-06 ENCOUNTER — Encounter (HOSPITAL_BASED_OUTPATIENT_CLINIC_OR_DEPARTMENT_OTHER): Payer: Self-pay

## 2015-12-06 ENCOUNTER — Other Ambulatory Visit (HOSPITAL_BASED_OUTPATIENT_CLINIC_OR_DEPARTMENT_OTHER): Payer: Self-pay | Admitting: Orthopaedic Surgery

## 2015-12-06 VITALS — BP 112/78 | HR 66 | Temp 98.1°F | Ht 70.87 in | Wt 207.9 lb

## 2015-12-06 VITALS — BP 122/78 | HR 68 | Ht 70.0 in | Wt 207.0 lb

## 2015-12-06 DIAGNOSIS — S3289XD Fracture of other parts of pelvis, subsequent encounter for fracture with routine healing: Secondary | ICD-10-CM | POA: Insufficient documentation

## 2015-12-06 DIAGNOSIS — S32810A Multiple fractures of pelvis with stable disruption of pelvic ring, initial encounter for closed fracture: Secondary | ICD-10-CM

## 2015-12-06 DIAGNOSIS — R569 Unspecified convulsions: Secondary | ICD-10-CM | POA: Insufficient documentation

## 2015-12-06 DIAGNOSIS — X58XXXD Exposure to other specified factors, subsequent encounter: Secondary | ICD-10-CM | POA: Insufficient documentation

## 2015-12-06 DIAGNOSIS — S32810D Multiple fractures of pelvis with stable disruption of pelvic ring, subsequent encounter for fracture with routine healing: Secondary | ICD-10-CM
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
## 2015-12-06 NOTE — Telephone Encounter (Signed)
-----   Message from Adair Patterharlene Shawnee Brown sent at 12/06/2015  4:39 PM PDT -----  Regarding: [facility] - Patient discharged w/ limited medication. Only has enough for tonight and tomorrow  >> Elvera LennoxCHARLENE SHAWNEE BROWN 12/06/2015 04:39 PM  [facility] post op - Patient discharged w/ limited medication. Only has enough for tonight and tomorrow

## 2015-12-06 NOTE — Patient Instructions (Signed)
Have your blood drawn today for a phenytoin (Dilantin) level. I will call with the results and adjust the dose as needed.    Please continue not driving, no baths, no heights.    Congratulations on doing well and a healthy grandson.

## 2015-12-06 NOTE — Patient Instructions (Signed)
-   Weight bearing as tolerated, bilateral lower extremities   - Continue PT as before   - Symptomatic treatment of muscle strain with NSAIDs and heat therapy/warm baths   - Return to clinic on 01/03/16-- no need for XRs, but will obtain MRI left hip (to assess for etiologies such as avascular necrosis of femoral head) if pain persists
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
## 2015-12-06 NOTE — Telephone Encounter (Signed)
Spoke with pt's daughter, pt just left the house.  [facility]      Pt's daughter calls needing refill for Dilantin.  After this RN reviewed records from today it appears a prescription was written and printed.  Pt's daughter will check paperwork from today. Can not find paper prescription.  Pt has enough Dilantin for tonight and tomorrow.    Pt or pt's daughter will call the clinic in the morning.    Neurology Clinic:  Pt needs a prescription for Dilantin    [facility] [place] [id number] [address] [place] [phone].

## 2015-12-06 NOTE — Telephone Encounter (Signed)
Called Tiffany, who accompanied Casimiro NeedleMichael to his appt today. Let her know the phenytoin level was wnl, and no medication changes were needed, per Dartha LodgeAnna Krumpe ARNP. Caller confirmed understanding and will the the pt.

## 2015-12-06 NOTE — Telephone Encounter (Signed)
Protocol: MEDICATION QUESTION CALL-ADULT-AH  Affirmative: Caller has NON-URGENT medication question about med that PCP prescribed and triager unable to answer question  Disposition of Call PCP Within 24 Hours suggested.

## 2015-12-07 ENCOUNTER — Other Ambulatory Visit (HOSPITAL_BASED_OUTPATIENT_CLINIC_OR_DEPARTMENT_OTHER): Payer: Self-pay | Admitting: Adult Health

## 2015-12-07 ENCOUNTER — Encounter (HOSPITAL_BASED_OUTPATIENT_CLINIC_OR_DEPARTMENT_OTHER): Payer: Self-pay | Admitting: Adult Health

## 2015-12-07 DIAGNOSIS — R569 Unspecified convulsions: Secondary | ICD-10-CM

## 2015-12-07 LAB — PHENYTOIN, FREE AND TOTAL
Phenytoin (Dilantin): 13.3 ug/mL (ref 10.0–20.0)
Phenytoin (Free): 1.3 ug/mL (ref 1.0–2.0)
Phenytoin, Free/Total Ratio: 10 % (ref 8–14)

## 2015-12-07 MED ORDER — PHENYTOIN SODIUM EXTENDED 100 MG OR CAPS
400.0000 mg | ORAL_CAPSULE | Freq: Every evening | ORAL | Status: DC
Start: 2015-12-07 — End: 2016-08-21

## 2015-12-18 ENCOUNTER — Encounter (INDEPENDENT_AMBULATORY_CARE_PROVIDER_SITE_OTHER): Payer: Self-pay | Admitting: Family Medicine

## 2015-12-18 ENCOUNTER — Ambulatory Visit (INDEPENDENT_AMBULATORY_CARE_PROVIDER_SITE_OTHER): Payer: No Typology Code available for payment source | Admitting: Family Medicine

## 2015-12-18 VITALS — BP 111/77 | HR 71 | Temp 98.9°F | Ht 69.0 in | Wt 204.0 lb

## 2015-12-18 DIAGNOSIS — Z0000 Encounter for general adult medical examination without abnormal findings: Secondary | ICD-10-CM

## 2015-12-18 LAB — LIPID PANEL
Cholesterol (LDL): 120 mg/dL (ref <130)
Cholesterol/HDL Ratio: 3.3
HDL Cholesterol: 58 mg/dL (ref >39)
Non-HDL Cholesterol: 135 mg/dL (ref 0–159)
Total Cholesterol: 193 mg/dL (ref <200)
Triglyceride: 77 mg/dL (ref <150)

## 2015-12-18 NOTE — Progress Notes (Signed)
Reason for visit: Pt is here to establish care    Have you seen a specialist since your last visit: YES      [facility], Neuro clinic and orthopedic. Cranio facial clinic  Also sees Dr. Sela HuaPatrick Mccormick.     11/27/2015  WAIIS / Mindscape has been reviewed/entered in Epic: {y/n:YES  HM DUE : YES (flu, cholesterol)  Future recommended date vaccine(s):YES (flu)  PHQ:  Cody Earlhristina Ladan, MA, CMA    Health Maintenance   Topic Date Due   . HIV Screen  30-Jun-1966   . Cholesterol Test  03/04/2002   . Influenza Vaccine (1) 05/25/2015   . Diabetes Screen  11/06/2018   . Tetanus Vaccine  12/16/2024       Future Appointments  Date Time Provider Department Center   12/18/2015 11:00 AM Shon Batonhiu, Nelson M, MD [facility] NKDM   01/03/2016 11:00 AM Jennye MoccasinGruss, Joseph S, MD [facility]   01/03/2016 2:00 PM H86 GREEN TEAM Launa FlightH Orth [facility] OR[facility]O   02/12/2016 1:00 PM Sheryn BisonQuinn, Michelle, AUD CCC-A Marca AnconaH Aud [facility]ENT   02/12/2016 2:15 PM UNASSIGNED NEURO ATTND H Neur H F WE       1. What's your relationship to the patient? self     2. Has your address changed since your last office visit? N/A     3. Has your phone number changed since your last office visit?  N/A     4. Is the patient 13 years or older?  YES
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
## 2015-12-18 NOTE — Patient Instructions (Addendum)
1. Eat more fruits and vegetables (majority of what you eat).    2. Make your carbohydrates whole wheat (pastas, breads, cereals, rice).    3. Make your protein lean (white meat, beans).    Try to exercise at least 150 minutes per week with aerobic activity that breaks a sweat!    Your goal weight is 200 pounds (ideally 170 pounds)

## 2015-12-19 LAB — HIV ANTIGEN AND ANTIBODY SCRN
HIV Antigen and Antibody Interpretation: NONREACTIVE
HIV Antigen and Antibody Result: NONREACTIVE

## 2015-12-20 ENCOUNTER — Encounter (INDEPENDENT_AMBULATORY_CARE_PROVIDER_SITE_OTHER): Payer: Self-pay | Admitting: Family Medicine

## 2015-12-22 ENCOUNTER — Other Ambulatory Visit (HOSPITAL_BASED_OUTPATIENT_CLINIC_OR_DEPARTMENT_OTHER): Payer: Self-pay

## 2016-01-03 ENCOUNTER — Encounter (HOSPITAL_BASED_OUTPATIENT_CLINIC_OR_DEPARTMENT_OTHER): Payer: Self-pay

## 2016-01-03 ENCOUNTER — Ambulatory Visit (HOSPITAL_BASED_OUTPATIENT_CLINIC_OR_DEPARTMENT_OTHER): Payer: No Typology Code available for payment source | Admitting: Orthopaedic Surgery

## 2016-01-03 ENCOUNTER — Encounter (HOSPITAL_BASED_OUTPATIENT_CLINIC_OR_DEPARTMENT_OTHER): Payer: No Typology Code available for payment source

## 2016-01-03 ENCOUNTER — Ambulatory Visit (HOSPITAL_BASED_OUTPATIENT_CLINIC_OR_DEPARTMENT_OTHER): Payer: No Typology Code available for payment source | Attending: Orthopaedic Surgery

## 2016-01-03 VITALS — BP 119/81 | HR 61 | Temp 98.2°F | Resp 16 | Ht 71.0 in | Wt 203.0 lb

## 2016-01-03 VITALS — BP 122/81 | HR 61 | Temp 97.9°F | Ht 71.0 in | Wt 211.0 lb

## 2016-01-03 DIAGNOSIS — S0291XD Unspecified fracture of skull, subsequent encounter for fracture with routine healing: Secondary | ICD-10-CM

## 2016-01-03 DIAGNOSIS — S0219XD Other fracture of base of skull, subsequent encounter for fracture with routine healing: Secondary | ICD-10-CM

## 2016-01-03 DIAGNOSIS — S32810D Multiple fractures of pelvis with stable disruption of pelvic ring, subsequent encounter for fracture with routine healing: Secondary | ICD-10-CM

## 2016-01-03 DIAGNOSIS — S0292XD Unspecified fracture of facial bones, subsequent encounter for fracture with routine healing: Secondary | ICD-10-CM

## 2016-01-03 NOTE — Progress Notes (Signed)
I, Tylin Force, saw and evaluated the patient. I have reviewed the resident's documentation and agree.

## 2016-01-03 NOTE — Progress Notes (Signed)
I, Nazanin Kinner, saw and evaluated the patient. I have reviewed the resident's documentation and agree.
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
## 2016-01-03 NOTE — Patient Instructions (Addendum)
Thank you for coming to the craniofacial clinic today.     Today you met with Dr. Candyce Churn following your recent craniofacial surgery.     You are healing well from this surgery.     It is highly recommended that you make an appointment with opthalmology as your last visit was too soon following surgery and it is important to have your vision checked.      If you have any concerns regarding your healing, wounds, pain or infection please call the clinic.   Our recommendation is that you do not work or drive if you are taking narcotic pain medicine.   Please be aware medication refills may take up to three business days.   If you need to schedule or change a follow up appointment, please call [phone].

## 2016-01-03 NOTE — Progress Notes (Signed)
Cody ShamsMichael James Mccormick accompanied by dad, Rosanne AshingJim.  Work/School Status: Dad reports trying to get pt on disability. Pt currently unemployed.     Pain medications/refill needs: Pt not taking anything regularly for pain management. Pt seldomly takes Tylenol PRN. Pt denies pain to face.   Mood: Dad reports pt mood baseline.   Diplopia: Denied.   Blurry vision: Present. Pt reports worst in R eye. Pt states does not always see everything. Such as his truck, reports when he looks at his truck he sees parts of it however unable to see other parts of his truck. Pt.'s dad states when dad does art on the computer pt is unable to see it unless it is enlarged.      Malocclusion: Denied. Pt with several missing teeth to front and back of mouth.   Stuffy nose: "I'm always stuffy it seems like" which pt reports has occurred over the last year.    Decreased sensation: Denied.   Diet: Reg diet.   Splints: N/A  Arch bars/elastics: N/A    Using teach back method, education topics included at this visit:    Post op craniofacial care      Post Education response: Understanding verbalized and pt and dad in agreement with plan.
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
## 2016-01-15 ENCOUNTER — Telehealth (HOSPITAL_BASED_OUTPATIENT_CLINIC_OR_DEPARTMENT_OTHER): Payer: Self-pay

## 2016-01-15 NOTE — Telephone Encounter (Signed)
(  TEXTING IS AN OPTION FOR [facility] CLINICS ONLY)  Is this a [facility] clinic? No      RETURN CALL: Detailed message on voicemail only      SUBJECT:  General Message     REASON FOR REQUEST: Follow up to request for letter for insurance.    MESSAGE: Patient's daughter states they have had an ongoing issue with their insurance covering some of the care needed for the patient, related to eye care. During a visit in February the patient's daughter spoke with a person that the [facility]. They offered to have a letter written up stating the medical needs for the patient's care to the insurance company. During the last visit, April 12th, the patient's daughter was not the person who brought the patient and did not follow up. Please coordinate a follow up to this request with the patient's daughter.

## 2016-01-15 NOTE — Telephone Encounter (Signed)
[facility]ed to [facility] Rinaldo Cloudamela and Paxtonhe both regarding a message below on Letter for [employer].

## 2016-01-16 ENCOUNTER — Other Ambulatory Visit (HOSPITAL_BASED_OUTPATIENT_CLINIC_OR_DEPARTMENT_OTHER): Payer: Self-pay

## 2016-01-24 ENCOUNTER — Ambulatory Visit (HOSPITAL_BASED_OUTPATIENT_CLINIC_OR_DEPARTMENT_OTHER): Payer: No Typology Code available for payment source | Attending: Orthopaedic Surgery | Admitting: Orthopaedic Surgery

## 2016-01-24 ENCOUNTER — Ambulatory Visit: Payer: No Typology Code available for payment source

## 2016-01-24 DIAGNOSIS — S32810D Multiple fractures of pelvis with stable disruption of pelvic ring, subsequent encounter for fracture with routine healing: Secondary | ICD-10-CM

## 2016-01-24 DIAGNOSIS — M25551 Pain in right hip: Secondary | ICD-10-CM

## 2016-01-24 DIAGNOSIS — M25552 Pain in left hip: Secondary | ICD-10-CM | POA: Insufficient documentation

## 2016-01-24 NOTE — Progress Notes (Signed)
I saw and evaluated the patient and agree with the report above.

## 2016-01-26 NOTE — Telephone Encounter (Signed)
Called and spoke with patients daughter, Cody Mccormick. As a results I have sent an e-mail to [name] requesting an exception of the rule letter be written by him that will support the patients claim of needing specialty dental care. I let her know that I will call her back with Dr. Macario CarlsGruss's reply once I have received it.

## 2016-02-12 ENCOUNTER — Encounter (HOSPITAL_BASED_OUTPATIENT_CLINIC_OR_DEPARTMENT_OTHER): Payer: Self-pay

## 2016-02-12 ENCOUNTER — Ambulatory Visit (HOSPITAL_BASED_OUTPATIENT_CLINIC_OR_DEPARTMENT_OTHER): Payer: No Typology Code available for payment source | Attending: Audiologist | Admitting: Psychiatry

## 2016-02-12 ENCOUNTER — Ambulatory Visit (HOSPITAL_BASED_OUTPATIENT_CLINIC_OR_DEPARTMENT_OTHER): Payer: No Typology Code available for payment source | Admitting: Audiologist

## 2016-02-12 VITALS — BP 116/78 | HR 63 | Temp 97.7°F | Resp 16 | Ht 71.0 in | Wt 205.0 lb

## 2016-02-12 DIAGNOSIS — H9011 Conductive hearing loss, unilateral, right ear, with unrestricted hearing on the contralateral side: Secondary | ICD-10-CM

## 2016-02-12 DIAGNOSIS — H9072 Mixed conductive and sensorineural hearing loss, unilateral, left ear, with unrestricted hearing on the contralateral side: Secondary | ICD-10-CM | POA: Insufficient documentation

## 2016-02-12 DIAGNOSIS — R569 Unspecified convulsions: Secondary | ICD-10-CM | POA: Insufficient documentation

## 2016-02-12 DIAGNOSIS — H918X2 Other specified hearing loss, left ear: Secondary | ICD-10-CM | POA: Insufficient documentation

## 2016-02-12 DIAGNOSIS — IMO0001 Reserved for inherently not codable concepts without codable children: Secondary | ICD-10-CM

## 2016-02-12 MED ORDER — LAMOTRIGINE 25 MG OR TABS
ORAL_TABLET | ORAL | 2 refills | Status: DC
Start: 2016-02-12 — End: 2016-08-21
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
## 2016-02-12 NOTE — Patient Instructions (Addendum)
We have placed a referral to the traumatic brain injury (TBI) clinic within the physical medicine and rehabilitation department.  You can call to check on the status of this: [phone]  Continue phenytoin 400mg  daily until your next visit.  Start new medication, lamotrigine:  Week 1: Take 25mg  twice a day.  Week 2: Take 50mg  twice a day.  Week 3: Take 75mg  twice a day.  Week 4: Take 100mg  twice a day.  Week 5: Take 125mg  twice a day.  Week 6: Take 150mg  twice a day.  Week 7: Take 175mg  twice a day.  Week 8: Take 200mg  twice a day.

## 2016-02-12 NOTE — Telephone Encounter (Signed)
Daughter dropped into clinic asking about the letter, reviewed EPIC, no letter found.  Reviewed Emails and Media, no forms found nor addressed.  Informed daughter I would look into it so she did not have to wait in clinic, and would call her back.  She agreed with plan
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
## 2016-02-13 ENCOUNTER — Telehealth (HOSPITAL_BASED_OUTPATIENT_CLINIC_OR_DEPARTMENT_OTHER): Payer: Self-pay

## 2016-02-13 NOTE — Telephone Encounter (Signed)
Received faxed prior auth request form [facility] in [place] for lamotrigine 25 mg tab. Faxed with confirmation to pharmacy prior auth departmen, fax [phone].

## 2016-02-16 ENCOUNTER — Telehealth (HOSPITAL_BASED_OUTPATIENT_CLINIC_OR_DEPARTMENT_OTHER): Payer: Self-pay

## 2016-02-16 NOTE — Telephone Encounter (Signed)
Prior Authorization Request Pending    Prescription Insurance: [facility]  Pharmacy: [facility]  Medication: Lamotrigine Taper  Date Submitted: 02/16/16    Type of Request:    [X]  Fax:     [  ] Telephone:    [  ] Other:     Please allow up to 3-5 business days for review/response.

## 2016-02-16 NOTE — Telephone Encounter (Signed)
Called and spoke with patients daughter, Cody Mccormick. Informed her that [name] is aware of the letter that is being requested and he plans on writing it the next time he is in clinic which will be 3/31. She asked if we could emphasize the need for medical necessity. I also gave her [name]'s contact information, being [name] and I let her know that I would bring him up to speed on this matter. Patients daughter satisfied.

## 2016-02-20 NOTE — Telephone Encounter (Signed)
Received prior auth for lamotrigine 25 mg tabs from [employer], good until 04/17/2016. Spoke with daughter Elmarie Shileyiffany to let her know.

## 2016-02-21 NOTE — Telephone Encounter (Signed)
Prior Authorization Approval    Patient has received insurance approval for coverage of the following medication:    Prescription Insurance: [facility]  Pharmacy: [facility]  Medication: Lamotrigine Taper  Approved dates: 02/20/16 - 04/17/16  Approval #: [id number]/A    PHARMACY NOTIFIED?   [  ] Fax:    [  ] Telephone:   [X]  Other: refer to TE from 02/13/16    Additional Comments:  Approval letter has been scanned into Media.

## 2016-03-07 ENCOUNTER — Encounter (HOSPITAL_BASED_OUTPATIENT_CLINIC_OR_DEPARTMENT_OTHER): Payer: No Typology Code available for payment source | Admitting: Physical Medicine & Rehabilitation

## 2016-03-13 ENCOUNTER — Encounter (HOSPITAL_BASED_OUTPATIENT_CLINIC_OR_DEPARTMENT_OTHER): Payer: No Typology Code available for payment source | Admitting: Physical Medicine & Rehabilitation

## 2016-03-20 ENCOUNTER — Encounter (HOSPITAL_BASED_OUTPATIENT_CLINIC_OR_DEPARTMENT_OTHER): Payer: No Typology Code available for payment source | Admitting: Physical Medicine & Rehabilitation
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
## 2016-04-05 ENCOUNTER — Telehealth (HOSPITAL_BASED_OUTPATIENT_CLINIC_OR_DEPARTMENT_OTHER): Payer: Self-pay

## 2016-04-05 DIAGNOSIS — G40909 Epilepsy, unspecified, not intractable, without status epilepticus: Secondary | ICD-10-CM

## 2016-04-05 DIAGNOSIS — S069XAS Unspecified intracranial injury with loss of consciousness status unknown, sequela: Secondary | ICD-10-CM

## 2016-04-05 NOTE — Telephone Encounter (Signed)
Received faxed request to [place] lamotrigine 25 mg tabs. This was already done last may. ? If pt in fact needs a refill at higher strength because he should be uptitrated by now.      Tried to reach pt who was not at home. Spoke with his daughter who he lives with and she'll get message to him to call clinic. Daughter stated he didn't start lamotrigine "right away".

## 2016-04-05 NOTE — Telephone Encounter (Addendum)
Spoke with Tiffany after speaking with pharmacy. Apparently prior auth sent in error and script actually available for pick up.

## 2016-04-24 DIAGNOSIS — G40909 Epilepsy, unspecified, not intractable, without status epilepticus: Secondary | ICD-10-CM | POA: Insufficient documentation

## 2016-04-24 DIAGNOSIS — S069XAS Unspecified intracranial injury with loss of consciousness status unknown, sequela: Secondary | ICD-10-CM | POA: Insufficient documentation

## 2016-04-24 MED ORDER — LAMOTRIGINE 200 MG OR TABS
200.0000 mg | ORAL_TABLET | Freq: Two times a day (BID) | ORAL | 11 refills | Status: DC
Start: 2016-04-24 — End: 2016-08-21

## 2016-04-24 NOTE — Addendum Note (Signed)
Addended byClent Ridges T on: 04/24/2016 05:07 PM     Modules accepted: Orders

## 2016-04-24 NOTE — Telephone Encounter (Signed)
Received another request for prior auth of lamotrigine 25 mg tabs but spoke with pharmacy and pt ok on script. Pharmacy adjusted accordingly.    Will need script for lamotrigine 200 mg twice daily. Left message for daughter Cody Mccormick to call me back and confirm she got message that next refill may be 200 mg strength tablet.

## 2016-04-24 NOTE — Telephone Encounter (Addendum)
As requested, prescription submitted for lamotrigine 200 mg twice a day.  The pharmacy was not specified.  I picked one of the 2 favorites, namely [facility] in [place].

## 2016-04-25 NOTE — Telephone Encounter (Addendum)
Spoke with Cody Mccormick. Pt apparently with his dad. Spoke with pt's father who reports pt currently at lamotrigine 175 mg twice daily. He understands new tablet will be 200 mg strength. States pt is doing well. States pt is "working really hard around the place. He's even driving the forklift". Advised that seizure precautions would include no driving or performing hazardous activity until seizure free for 6 months. Dad stated he understood.     F/u with neurology 8/21.

## 2016-04-30 ENCOUNTER — Ambulatory Visit (HOSPITAL_BASED_OUTPATIENT_CLINIC_OR_DEPARTMENT_OTHER)
Payer: No Typology Code available for payment source | Attending: Otology & Neurotology | Admitting: Otology & Neurotology

## 2016-04-30 ENCOUNTER — Encounter (HOSPITAL_BASED_OUTPATIENT_CLINIC_OR_DEPARTMENT_OTHER): Payer: Self-pay | Admitting: Otology & Neurotology

## 2016-04-30 VITALS — BP 114/79 | HR 65 | Temp 97.7°F | Resp 18 | Ht 71.0 in | Wt 201.0 lb

## 2016-04-30 DIAGNOSIS — IMO0001 Reserved for inherently not codable concepts without codable children: Secondary | ICD-10-CM

## 2016-04-30 DIAGNOSIS — Z6828 Body mass index (BMI) 28.0-28.9, adult: Secondary | ICD-10-CM

## 2016-04-30 DIAGNOSIS — S0219XA Other fracture of base of skull, initial encounter for closed fracture: Secondary | ICD-10-CM

## 2016-04-30 DIAGNOSIS — H918X2 Other specified hearing loss, left ear: Secondary | ICD-10-CM | POA: Insufficient documentation

## 2016-04-30 DIAGNOSIS — H9072 Mixed conductive and sensorineural hearing loss, unilateral, left ear, with unrestricted hearing on the contralateral side: Secondary | ICD-10-CM
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
## 2016-05-07 NOTE — Progress Notes (Signed)
I, Cody Mccormick, examined Cody Mccormick, elicited the history and reviewed the diagnostic studies with the patient and Cody Mccormick.  I personally spent 15 minutes of a 20 minute visit in counseling with [name] and agree with the edited note above.      Briefly:  Exam:  General appearance: healthy, alert, no distress, gait normal, voice normal, alert and oriented    Microscopic ear exam:   Right external ear normal, ear canal normal, TM - normal, middle ear aerated  Left external ear normal , ear canal normal, TM - normal, middle ear aerated    Dix Hallpike normal    Facial nerve function intact, but mild asymmetry on left.  .  Diagnostics Studies  An audiogram was obtained 02/12/2016 and was available for review.  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 May benefit from middle ear exploration to improve hearing.  No obvious concerning findings on exam today. Discussed risks and benefits of surgery. Will contact clinic if decides to proceed.

## 2016-05-13 ENCOUNTER — Encounter (HOSPITAL_BASED_OUTPATIENT_CLINIC_OR_DEPARTMENT_OTHER): Payer: Self-pay | Admitting: Neurology

## 2016-05-13 ENCOUNTER — Ambulatory Visit (HOSPITAL_BASED_OUTPATIENT_CLINIC_OR_DEPARTMENT_OTHER): Payer: No Typology Code available for payment source | Attending: Neurology | Admitting: Neurology

## 2016-05-13 VITALS — BP 125/78 | HR 65 | Temp 97.9°F | Resp 16 | Ht 71.0 in | Wt 201.0 lb

## 2016-05-13 DIAGNOSIS — S069X9S Unspecified intracranial injury with loss of consciousness of unspecified duration, sequela: Secondary | ICD-10-CM | POA: Insufficient documentation

## 2016-05-13 DIAGNOSIS — Z6828 Body mass index (BMI) 28.0-28.9, adult: Secondary | ICD-10-CM

## 2016-05-13 DIAGNOSIS — S069XAS Unspecified intracranial injury with loss of consciousness status unknown, sequela: Secondary | ICD-10-CM

## 2016-05-13 DIAGNOSIS — G40909 Epilepsy, unspecified, not intractable, without status epilepticus: Secondary | ICD-10-CM | POA: Insufficient documentation

## 2016-05-13 NOTE — Progress Notes (Signed)
I saw, evaluated, and discussed the patient with Dr. Steffanie DunnAmita Singh.  I agree with the evaluation and plan as detailed in today's clinic notes and clinic forms.      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  He has increased his lamotrigine to 200 mg twice a day.  We will now start a slow taper down and off of his phenytoin.  His father who accompanied him to clinic wondered whether he may be more cognitively impaired on the 2 medications.  We are hoping this improves as he tapers down and off of the phenytoin.  So far, he has had no breakthrough seizures.  The hyperreflexia on the right and the reduced or absent knee reflex on the left seemed to be old findings related to his original injuries.

## 2016-05-13 NOTE — Patient Instructions (Signed)
You were seen in the neurology clinic for follow up of your seizures.    Since you have not had a seizure, you can now decrease the amount of dilantin (phenytoin) that you are taking and taper it off.    You should decrease your phenytoin as follows:    300 mg at night for 2 weeks  200 mg at night for 2 weeks  100 mg at night for 2 weeks  Stop dilantin (phenytoin)    You should continue to take lamotrigine 200 mg twice per day and return to the clinic in 4 months. If you have a seizure, please call the neurology clinic to return to clinic sooner and to talk about restarting your medication.
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
## 2016-07-10 ENCOUNTER — Encounter (HOSPITAL_BASED_OUTPATIENT_CLINIC_OR_DEPARTMENT_OTHER): Payer: Self-pay

## 2016-07-10 ENCOUNTER — Telehealth (HOSPITAL_BASED_OUTPATIENT_CLINIC_OR_DEPARTMENT_OTHER): Payer: Self-pay

## 2016-07-10 NOTE — Telephone Encounter (Signed)
This encounter was opened in error.

## 2016-07-10 NOTE — Telephone Encounter (Signed)
Pt's daughter called. He is off dilantin and on lamotrigine 200 mg twice daily only. Daughter concerned that he's had on-going blurry vision which she feels is related to lamotrigine although also says blurry vision always there (but seems a bit worse) and it's hard for pt to express himself. " But he's always complaining about his eyes now".     Per daughter pt has had eye surgery in the past. Hasn't seen ophthalmologist since last march. Advised she schedule with them immediately which she said she would do.     Scheduled with [facility] for 10/25 at 10:30 am.

## 2016-07-16 ENCOUNTER — Telehealth (HOSPITAL_BASED_OUTPATIENT_CLINIC_OR_DEPARTMENT_OTHER): Payer: Self-pay | Admitting: Neurology

## 2016-07-16 NOTE — Telephone Encounter (Signed)
Spoke with caller; checking with Dr. Thedore MinsSingh on neuro-ophthalmology plan before determining if the 07/29/16 appointment should be kept.

## 2016-07-16 NOTE — Telephone Encounter (Signed)
(  TEXTING IS AN OPTION FOR [facility] CLIN[facility] ONLY)  Is this a [facility] clinic? No      RETURN CALL: Detailed message on voicemail only      SUBJECT:  Cancellation/Reschedule Request     ORIGINAL APPOINTMENT DATE: 10/25, TIME: 10:30am  REASON: Patient's daughter cancelled patient's appointment on 10/25 and states was advised by "Olegario MessierKathy" at clinic that [facility] wants patient to see neuro-opthalmology prior to coming back and [facility].    REQUESTED DATE: call to discuss, [facility] scheduled appointment for 07/29/16 with Dr. Thedore MinsSingh at 2:30pm, but patient's daughter would like to know about other options prior to February 2018 (Dr. Doristine ChurchSingh's next availability at time of inquiry).  Patient's 10/25 appointment that was cancelled was with [id number] and [facility] unable to access any appointments for this provider TIME: call to discuss all options besides 11/6 scheduled visit  UNABLE TO APPOINT BECAUSE: [facility] scheduled 11/6 at 2:30pm, but patient's daughter would like to know other options for different providers or with [facility] provider.    Please contact patient's daughter with an update as soon as possible.  Thanks!

## 2016-07-16 NOTE — Telephone Encounter (Signed)
(  TEXTING IS AN OPTION FOR [facility] CLINICS ONLY)  Is this a [facility] clinic? No      RETURN CALL: Detailed message on voicemail only      SUBJECT:  Referral Request      REFERRING PROVIDER: per clinic review/patient has [facility]  SYMPTOM(S)/DIAGNOSIS: both eyes affected, but right eye worse, traumatic brain injury, patient has gone to multiple ophthalmologists who are unsure of what's going on, eyes "messed up" double vision/blurry vision and can't read, and unsure if seizure medication making vision worse, but vision worsening since June or July of 2017" per patient's daughter    CLINIC AND LOCATION: [facility]  At [address], [place], [place] [id number]  NAME OF PROVIDER: per [facility] medical review    TYPE OF SPECIALTY: neuro-ophthalmology    CLINIC PHONE: [phone] CLINIC FAX: unknown  HAS THE REFERRAL ALREADY BEEN REQUESTED?: no  ADDITIONAL INFORMATION: Patient's daughter states has tried calling [facility] for about a week as of 07/16/2016 and has been unable to get thru to new patient scheduling line.  Caller states needing clinic to refer patient to [facility] needs to medically review referral before seeing patient.    Please contact patient's daughter with an update as soon as possible.  Thanks!

## 2016-07-16 NOTE — Telephone Encounter (Signed)
Routing TE to Dr. Steffanie DunnAmita Mccormick to see if she is willing to refer Cody NeedleMichael to the requested Neuro-Ophthalmology Clinic, or if patient should obtain this from his PCP. If Dr. Thedore Mccormick will refer, should we cancel the 07/29/16 [facility] until he's seen by Neuro-Ophthalmology?

## 2016-07-17 ENCOUNTER — Encounter (HOSPITAL_BASED_OUTPATIENT_CLINIC_OR_DEPARTMENT_OTHER): Payer: No Typology Code available for payment source | Admitting: Unknown Physician Specialty

## 2016-07-18 NOTE — Telephone Encounter (Signed)
Phoned caller to relay Dr. Doristine ChurchSingh's response; caller understood and will follow-up as recommended.

## 2016-07-29 ENCOUNTER — Encounter (HOSPITAL_BASED_OUTPATIENT_CLINIC_OR_DEPARTMENT_OTHER): Payer: No Typology Code available for payment source | Admitting: Neurology
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
## 2016-08-05 ENCOUNTER — Telehealth (HOSPITAL_BASED_OUTPATIENT_CLINIC_OR_DEPARTMENT_OTHER): Payer: Self-pay | Admitting: Neurology

## 2016-08-05 ENCOUNTER — Telehealth (INDEPENDENT_AMBULATORY_CARE_PROVIDER_SITE_OTHER): Payer: Self-pay | Admitting: Family Medicine

## 2016-08-05 ENCOUNTER — Other Ambulatory Visit (HOSPITAL_BASED_OUTPATIENT_CLINIC_OR_DEPARTMENT_OTHER): Payer: Self-pay | Admitting: Neurology

## 2016-08-05 ENCOUNTER — Encounter (INDEPENDENT_AMBULATORY_CARE_PROVIDER_SITE_OTHER): Payer: Self-pay | Admitting: Family Medicine

## 2016-08-05 DIAGNOSIS — R569 Unspecified convulsions: Secondary | ICD-10-CM

## 2016-08-05 NOTE — Telephone Encounter (Signed)
(  TEXTING IS AN OPTION FOR [facility] CLIN[facility] ONLY)  Is this a [facility] clinic? Yes. What is the mobile number we can use to get a hold of you via text? same as above      RETURN CALL: Detailed message on voicemail only      SUBJECT:  General Message     REASON FOR REQUEST: patient's daughter would like to know if an order for blood work would be submitted for the patient. Lamotrigine, patient is taking this for his seizures, he was told that his levels may be to high, and possibly effecting his vision he would like to have blood work completed to confirm if they are. Please call to confirm that an order has been completed.     MESSAGE: na

## 2016-08-05 NOTE — Telephone Encounter (Signed)
[facility] on 08/08/2016.  No orders found.  Lab appointment notes state:  "lamotrigine level for Dr [facility]insSingh [facility]"  Routing to provider for consideration of orders.

## 2016-08-05 NOTE — Telephone Encounter (Signed)
Spoke with daughter Cody Mccormick. Due to family emergencies pt wasn't able to get to neuro-ophtho  appointment at [facility] and is re-scheduled for mid December(also reason he couldn't make appointment with Dr. Thedore MinsSingh).      Pt re-scheduled with Dr. Thedore MinsSingh for mid February. Tiffany hoping an order could be put in for a lamotrigine level. Planning on getting level drawn [facility]. Daughter asked pt be added to wait list for sooner appointment with Dr. Thedore MinsSingh if one becomes available.     Advised ED if pt's symptoms worsen. Tiffany stated understanding.

## 2016-08-05 NOTE — Telephone Encounter (Signed)
Error

## 2016-08-06 NOTE — Telephone Encounter (Signed)
Order is in EPIC.    Thank you so much!

## 2016-08-08 ENCOUNTER — Telehealth (HOSPITAL_BASED_OUTPATIENT_CLINIC_OR_DEPARTMENT_OTHER): Payer: Self-pay

## 2016-08-08 ENCOUNTER — Other Ambulatory Visit (INDEPENDENT_AMBULATORY_CARE_PROVIDER_SITE_OTHER): Payer: No Typology Code available for payment source

## 2016-08-08 ENCOUNTER — Other Ambulatory Visit (HOSPITAL_BASED_OUTPATIENT_CLINIC_OR_DEPARTMENT_OTHER)
Admit: 2016-08-08 | Discharge: 2016-08-08 | Disposition: A | Discharge status: Home / Self Care | Payer: No Typology Code available for payment source | Attending: Neurology | Admitting: Neurology

## 2016-08-08 DIAGNOSIS — R569 Unspecified convulsions: Secondary | ICD-10-CM | POA: Insufficient documentation

## 2016-08-08 NOTE — Telephone Encounter (Signed)
Received call from pt's grandaughter Tiffany. They have not gotten lamotrigine level drawn. She wanted to let clinic know that pt was advised by another outside  provider  that he could cut his lamotrigine dose in half. Pt now taking lamotrigine 100 mg twice daily. Will get level drawn today or tomorrow if pt agreeable.     Advised pt should f/u with PCP as soon as possible. Tiffany indicated that it's very hard to convince pt he needs to make appointments.

## 2016-08-08 NOTE — Progress Notes (Signed)
COURTESY BLOOD DRAW. ORDERS IN EPIC

## 2016-08-13 ENCOUNTER — Telehealth (HOSPITAL_BASED_OUTPATIENT_CLINIC_OR_DEPARTMENT_OTHER): Payer: Self-pay

## 2016-08-13 LAB — LAMOTRIGINE: Lamotrigine: 3.3 ug/mL (ref 2.0–20.0)

## 2016-08-13 NOTE — Telephone Encounter (Signed)
Pt and daughter Cody Mccormick callling wanting to know lamotrigine level drawn 11/16. Pt had been on decreased dose of lamotrigine 100 mg twice daily for about 48 hours when level done. Level came back 3.3.    Discussed pt at low end of therapeutic. Scheduled for [facility]/29. Will check with provider about level prior.     Pt states no improvement in vision. Tiffany trying for sooner appointment with neuro-ophtho at [facility] but for now going to be seen mid December.

## 2016-08-14 NOTE — Telephone Encounter (Signed)
Spoke with Cody Mccormick to let her know I spoke with Dr. Manfred ArchLongstreth. Pt should make clinic appointment 11/29 to discuss medication. If another level needed to be determined at that time. Confirmed appointment time with Cody Mccormick who said she will bring pt.

## 2016-08-21 ENCOUNTER — Encounter (HOSPITAL_BASED_OUTPATIENT_CLINIC_OR_DEPARTMENT_OTHER): Payer: Self-pay | Admitting: Unknown Physician Specialty

## 2016-08-21 ENCOUNTER — Ambulatory Visit (HOSPITAL_BASED_OUTPATIENT_CLINIC_OR_DEPARTMENT_OTHER): Payer: No Typology Code available for payment source | Attending: Adult Health | Admitting: Adult Health

## 2016-08-21 VITALS — BP 129/84 | HR 62 | Temp 97.5°F | Resp 16 | Ht 71.0 in | Wt 205.0 lb

## 2016-08-21 DIAGNOSIS — Z6828 Body mass index (BMI) 28.0-28.9, adult: Secondary | ICD-10-CM

## 2016-08-21 DIAGNOSIS — R569 Unspecified convulsions: Secondary | ICD-10-CM | POA: Insufficient documentation

## 2016-08-21 MED ORDER — LAMOTRIGINE 150 MG OR TABS
150.0000 mg | ORAL_TABLET | Freq: Two times a day (BID) | ORAL | 12 refills | Status: DC
Start: 2016-08-21 — End: 2016-09-04

## 2016-08-21 NOTE — Patient Instructions (Addendum)
Mr. Riccardo DubinMichael Holsonback is here with his father and sister for F/U post-traumatic epilepsy.  11/07/15 EEG abnormal in the past - left anterior temporal interictal epileptiform discharges.  Last witnessed seizure was partial seizure symptoms (blank stare) during Rehab clinic Feb. 2017 then family witnessed generalized seizure (tight tone).  Last clinic visit with Dr. Manfred ArchLongstreth and Dr. Steffanie DunnAmita Singh - [name] followed the plan to wean off Phenytoin and continued lamotrigine 200mg  twice a day.  Noticed  Problems with vision - [facility] lowered lamotrigine from 200mg  twice a day to 100mg  twice a day.  Mr. Reubin MilanHartman's vision did not improve - he has an appointment to follow-up with an opthalmologist 09/04/16.      Therapeutic recommendation:  -Lamotrigine level while taking Lamotrigine 100mg  twice a day for 2 weeks.  - EEG -requested per family to follow up abnormal EEG  -Prefers to continue Lamotrigine 100mg  twice a day until levels returns  -Agreed to increase Lamotrigine to 150mg  twice a day if levels still low- informed low levels expected -(RX for Lamotrigine 150mg  tabs 100mg  Q AM and 150mg  Q PM x 1 week then 150mg  twice a day - escript to [facility] [place])  -Call the 24 hour community careline [phone] for any questions regarding side effects or seizures.  -Call 911 for any convulsive seizure - call 911 for seizures (staring episodes/repetitive speech or motion/ lip smacking/unresponsive) lasting 5 min., back to back seizures; taking longer to return to self; injuries to face, eyes, head, neck or injuries preventing ability to walk.    Monitor side effects: note timing of symptoms in relation to taking the lamotrigine pill (med side effects typically occur within 1-2 hours of taking the pill).    Activity restrictions until seizure free for 6 months: no driving, showers instead of baths, no working around fire or any situation where a lapse of attention can lead to injury  or death.
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
## 2016-08-27 ENCOUNTER — Other Ambulatory Visit (HOSPITAL_BASED_OUTPATIENT_CLINIC_OR_DEPARTMENT_OTHER)
Admit: 2016-08-27 | Discharge: 2016-08-27 | Disposition: A | Discharge status: Home / Self Care | Payer: No Typology Code available for payment source | Attending: Adult Health | Admitting: Adult Health

## 2016-08-27 ENCOUNTER — Other Ambulatory Visit (INDEPENDENT_AMBULATORY_CARE_PROVIDER_SITE_OTHER): Payer: No Typology Code available for payment source

## 2016-08-27 DIAGNOSIS — R569 Unspecified convulsions: Secondary | ICD-10-CM

## 2016-08-29 LAB — LAMOTRIGINE: Lamotrigine: 4.5 ug/mL (ref 2.0–20.0)

## 2016-09-04 ENCOUNTER — Other Ambulatory Visit (HOSPITAL_BASED_OUTPATIENT_CLINIC_OR_DEPARTMENT_OTHER): Payer: Self-pay | Admitting: Nurse Practitioner

## 2016-09-04 ENCOUNTER — Telehealth (HOSPITAL_BASED_OUTPATIENT_CLINIC_OR_DEPARTMENT_OTHER): Payer: Self-pay

## 2016-09-04 DIAGNOSIS — R569 Unspecified convulsions: Secondary | ICD-10-CM

## 2016-09-04 MED ORDER — LAMOTRIGINE 25 MG OR TABS
25.0000 mg | ORAL_TABLET | Freq: Two times a day (BID) | ORAL | 2 refills | Status: DC
Start: 2016-09-04 — End: 2017-06-10

## 2016-09-04 NOTE — Telephone Encounter (Signed)
Spoke with Tiffany to let her know script put in for lamotrigine 25 mg tabs. Pt to get repeat lamotrigine level two weeks after increasing to 125 mg twice daily. Order for blood work available (they usually go to a [place] clinic for draw).

## 2016-09-04 NOTE — Telephone Encounter (Signed)
Pt's daughter Elmarie Shileyiffany called wanting to know last lamotrigine level. Level drawn 12/5=4.4. Pt currently on lamotrigine 100 mg twice daily. Plan was to increase to 150 mg twice daily if level low.    Tiffany hoping that pt can try 125 mg twice daily and have level re-checked. Would need another script for lamotrigine 25 mg tab sent to pharmacy and order for blood draw put in.

## 2016-09-21 ENCOUNTER — Inpatient Hospital Stay: Payer: Self-pay

## 2016-09-26 ENCOUNTER — Other Ambulatory Visit (HOSPITAL_BASED_OUTPATIENT_CLINIC_OR_DEPARTMENT_OTHER): Payer: No Typology Code available for payment source

## 2016-11-04 ENCOUNTER — Encounter (HOSPITAL_BASED_OUTPATIENT_CLINIC_OR_DEPARTMENT_OTHER): Payer: No Typology Code available for payment source | Admitting: Neurology

## 2016-11-11 ENCOUNTER — Telehealth (HOSPITAL_BASED_OUTPATIENT_CLINIC_OR_DEPARTMENT_OTHER): Payer: Self-pay | Admitting: Neurology

## 2016-11-11 NOTE — Telephone Encounter (Signed)
(  TEXTING IS AN OPTION FOR [facility] CLIN[facility] ONLY)  Is this a [facility] clinic? No      RETURN CALL: General message on voicemail only      SUBJECT:  General Message     REASON FOR REQUEST: Appointment on March 5th with Dr. Thedore MinsSingh    MESSAGE: Patient's daughter wants to know what is this appointment for on March 5th and why does patient needs to come in? Patient's daughter wants to hear from clinic.

## 2016-11-12 NOTE — Telephone Encounter (Signed)
Left message for Tiffany: pt last seen by Dr. Thedore MinsSingh august 2017. Plan was to f/u in 4 months. Pt did see [facility] but needs consistent f/u with Dr. Thedore MinsSingh. Left my name and contact number for Tiffany to call back.

## 2016-11-25 ENCOUNTER — Encounter (HOSPITAL_BASED_OUTPATIENT_CLINIC_OR_DEPARTMENT_OTHER): Payer: No Typology Code available for payment source | Admitting: Neurology
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
## 2016-12-21 ENCOUNTER — Ambulatory Visit: Payer: Self-pay

## 2016-12-21 NOTE — Telephone Encounter (Signed)
Daughter calling for her dad who is not with her.  Stated he has been having pain since last night.  Conference call with daughter, pt, and CCL nurse.  Daughter states it is hard to get info from him due to his TBI.    Pt c/o:  Stomach and lower back pain, for hours and then went up to right chest.      Pain now is in the back.  Pain is 4-5/10.  Has not taken anything for the pain.  Has never had pain like this before. Extreme low back area pain.  A little tingling to upper legs. No trouble with urination.  Denies any trauma.    Stomach pain, all the way across belly button.  No cramps, just painful.  Pain started last night.  No nausea, vomiting, or diarrhea.  No fever.    Chest pain tonight, painful and it hurt.  Right side.  Constant pain.  No increase with breathing.  No trauma    Pt sounded awake, alert and oriented on the phone and did not sound like he was SOB.  Pt finally stated that he did turn his mattress on his bed like night but stated he just used his arms.    Pt has not tried any pain medication. Advised to take some tylenol or ibuprofen to see if this will help.      Daughter is very concerned.  Advised that he could go to the urgent care tomorrow but they have the option of going to the ER tonight.        Verbalized understanding.              Reason for Disposition  . Nursing judgment    Protocols used: NO PROTOCOL CALL: SICK ADULT-ADULT-OH

## 2016-12-22 ENCOUNTER — Inpatient Hospital Stay: Payer: Self-pay

## 2016-12-23 ENCOUNTER — Encounter (INDEPENDENT_AMBULATORY_CARE_PROVIDER_SITE_OTHER): Payer: Self-pay

## 2017-06-10 ENCOUNTER — Other Ambulatory Visit (HOSPITAL_BASED_OUTPATIENT_CLINIC_OR_DEPARTMENT_OTHER): Payer: Self-pay | Admitting: Neurology

## 2017-06-10 ENCOUNTER — Telehealth (HOSPITAL_BASED_OUTPATIENT_CLINIC_OR_DEPARTMENT_OTHER): Payer: Self-pay

## 2017-06-10 DIAGNOSIS — R569 Unspecified convulsions: Secondary | ICD-10-CM

## 2017-06-10 MED ORDER — LAMOTRIGINE 200 MG OR TABS
200.0000 mg | ORAL_TABLET | Freq: Two times a day (BID) | ORAL | 3 refills | Status: DC
Start: 2017-06-10 — End: 2017-08-29

## 2017-06-10 NOTE — Telephone Encounter (Signed)
Pt's daughter called. Needs refill of lamotrigine 200 mg twice daily sent to [facility] in [place].     Pt last seen November 2017. No showed for most recent appointment.    Scheduled f/u for next available with Dr. Thedore Mins December 10th at 3pm.     [name] is going to try to look for a closer neurologist.  Having a hard time getting pt to appointments.

## 2017-08-29 ENCOUNTER — Ambulatory Visit (INDEPENDENT_AMBULATORY_CARE_PROVIDER_SITE_OTHER): Payer: No Typology Code available for payment source | Admitting: Family Medicine

## 2017-08-29 VITALS — BP 135/61 | HR 58 | Temp 97.7°F | Wt 225.0 lb

## 2017-08-29 DIAGNOSIS — Z1211 Encounter for screening for malignant neoplasm of colon: Secondary | ICD-10-CM

## 2017-08-29 DIAGNOSIS — Z6831 Body mass index (BMI) 31.0-31.9, adult: Secondary | ICD-10-CM

## 2017-08-29 DIAGNOSIS — H9192 Unspecified hearing loss, left ear: Secondary | ICD-10-CM

## 2017-08-29 DIAGNOSIS — R569 Unspecified convulsions: Secondary | ICD-10-CM

## 2017-08-29 LAB — COMPREHENSIVE METABOLIC PANEL
ALT (GPT): 21 U/L (ref 10–48)
AST (GOT): 19 U/L (ref 9–38)
Albumin: 4.8 g/dL (ref 3.5–5.2)
Alkaline Phosphatase (Total): 75 U/L (ref 39–139)
Anion Gap: 10 (ref 4–12)
Bilirubin (Total): 0.6 mg/dL (ref 0.2–1.3)
Calcium: 10.3 mg/dL — ABNORMAL HIGH (ref 8.9–10.2)
Carbon Dioxide, Total: 29 meq/L (ref 22–32)
Chloride: 104 meq/L (ref 98–108)
Creatinine: 1.04 mg/dL (ref 0.51–1.18)
GFR, Calc, African American: >60 mL/min/{1.73_m2} (ref >59)
GFR, Calc, European American: >60 mL/min/{1.73_m2} (ref >59)
Glucose: 87 mg/dL (ref 62–125)
Potassium: 4.8 meq/L (ref 3.6–5.2)
Protein (Total): 7.1 g/dL (ref 6.0–8.2)
Sodium: 143 meq/L (ref 135–145)
Urea Nitrogen: 13 mg/dL (ref 8–21)

## 2017-08-29 LAB — LIPID PANEL
Cholesterol (LDL): 109 mg/dL (ref <130)
Cholesterol/HDL Ratio: 3.3
HDL Cholesterol: 54 mg/dL (ref >39)
Non-HDL Cholesterol: 123 mg/dL (ref 0–159)
Total Cholesterol: 177 mg/dL (ref <200)
Triglyceride: 70 mg/dL (ref <150)

## 2017-08-29 LAB — CBC (HEMOGRAM)
Hematocrit: 45 % (ref 38–50)
Hemoglobin: 14.9 g/dL (ref 13.0–18.0)
MCH: 28.8 pg (ref 27.3–33.6)
MCHC: 32.9 g/dL (ref 32.2–36.5)
MCV: 88 fL (ref 81–98)
Platelet Count: 148 10*3/uL — ABNORMAL LOW (ref 150–400)
RBC: 5.18 10*6/uL (ref 4.40–5.60)
RDW-CV: 12.8 % (ref 11.6–14.4)
WBC: 4.63 10*3/uL (ref 4.3–10.0)

## 2017-08-29 LAB — LAB ADD ON ORDER

## 2017-08-29 MED ORDER — LAMOTRIGINE 200 MG OR TABS
200.0000 mg | ORAL_TABLET | Freq: Two times a day (BID) | ORAL | 2 refills | Status: DC
Start: 2017-08-29 — End: 2018-02-09

## 2017-08-29 NOTE — Progress Notes (Signed)
Reason for visit: Pt is here for medication review    Have you seen a specialist since your last visit: YES      neurology    Declined flu shot  Pended colonscopy  08/29/17  WAIIS / [employer] has been reviewed/entered in Epic: {y/n:YES  HM DUE : YES (flu, colonoscopy, zoster)  Future recommended date vaccine(s):YES (flu, zoster  PHQ:due  Cody Dowhristina Beila Purdie, MA, CMA    Health Maintenance   Topic Date Due   . Colorectal Cancer Screening (FOBT/FIT)  03/04/2017   . Zoster Vaccine (1 of 2) 03/04/2017   . Influenza Vaccine (1) 06/23/2017   . Depression Screening (PHQ-2)  08/21/2017   . Diabetes Screening  11/06/2018   . Lipid Disorders Screening  12/17/2020   . Tetanus Vaccine  12/16/2024   . HIV Screening  Completed       No future appointments.

## 2017-08-29 NOTE — Progress Notes (Signed)
Family Medicine Clinic Note    Chief Complaint  51 year old M w/ TBI p/w epilepsy f/u.    History of Present Illness  The patient is here for epilepsy f/u, accompanied by his father.  He tried to wean off lamotrigine earlier this year; however, he suffered a breakthrough seizure in February.  They are interested in weaning off again, though have not seen neurology.  He also notes continued L hearing loss.    Problem list and medications reviewed and updated today as documented in the electronic health record.    Review of Systems   Constitutional: Negative.    Neurological: Negative for seizures.     Physical Exam   Blood pressure 135/61, pulse 58, temperature 97.7 F (36.5 C), temperature source Temporal, weight (!) 225 lb (102.1 kg), SpO2 100 %.  Gen: AAOx3, in NAD, resting comfortably  HEENT: NC/AT, EOMI, PER  CV: warm, well-perfused  Resp: no increased work of breathing  Ext: moving 4 ext    Assessment/Plan  51 year old M p/w:    1. Seizure (HCC)  Short-term refill of lamotrigine, but will need neurology f/u.  No rash or AE currently.  New referral; check labs today.  - LamoTRIgine 200 MG Oral Tab; Take 1 tablet (200 mg) by mouth 2 times a day.  Dispense: 60 tablet; Refill: 2  - REFERRAL TO NEUROLOGY  - CBC (HEMOGRAM)  - COMPREHENSIVE METABOLIC PANEL  - LIPID PANEL    2. Hearing loss of left ear, unspecified hearing loss type  [facility]; previously seen at [place].  - REFERRAL TO OTO-HEAD NECK SURGERY    3. Colon cancer screening  - OCCULT BLOOD BY IA, STL; Future    Follow-up: PRN    Leda QuailNelson Jemiah Ellenburg, MD  Family Medicine  [facility]

## 2017-09-01 ENCOUNTER — Encounter (HOSPITAL_BASED_OUTPATIENT_CLINIC_OR_DEPARTMENT_OTHER): Payer: No Typology Code available for payment source | Admitting: Neurology

## 2017-09-02 LAB — LAMOTRIGINE: Lamotrigine: 6.9 ug/mL (ref 2.0–20.0)

## 2017-09-05 ENCOUNTER — Telehealth (INDEPENDENT_AMBULATORY_CARE_PROVIDER_SITE_OTHER): Payer: Self-pay | Admitting: Family Medicine

## 2017-09-05 NOTE — Telephone Encounter (Signed)
Spoke with daughter who will have patient call his insurance to change his PCP and they will call back once it completed.    Daughter [place] [facility]

## 2017-09-05 NOTE — Telephone Encounter (Signed)
[place] [facility] [place] [facility] [place] and is not [facility] to a [facility] provider:    Benefit Information: Primary Care Provider  Primary Care Provider: Gaylene BrooksKANWARDEEP K [facility]   HCFA Provider ID: [id number]  Address:  [address]  [place], [place] [address]  Contact:   Telephone: [phone]  Fax: [phone]    [facility] has declined pt's referral due to insurance assigned PCP not being a [facility] provider.    Routing to front desk - please advise pt to update his insurance assigned PCP to a [facility].  Once completed please advise him to contact [facility] to relay info and schedule appt, thanks

## 2017-09-08 NOTE — Telephone Encounter (Signed)
We will re-route this to Dr. Ivery QualeNancy [facility] [place].    Closing TE.

## 2017-09-10 ENCOUNTER — Encounter (INDEPENDENT_AMBULATORY_CARE_PROVIDER_SITE_OTHER): Payer: Self-pay | Admitting: Family Medicine

## 2017-09-12 ENCOUNTER — Other Ambulatory Visit (INDEPENDENT_AMBULATORY_CARE_PROVIDER_SITE_OTHER): Payer: Self-pay | Admitting: Family Medicine

## 2017-09-12 DIAGNOSIS — Z1211 Encounter for screening for malignant neoplasm of colon: Secondary | ICD-10-CM

## 2017-09-15 LAB — OCCULT BLOOD BY IA, STL: Occult Bld 1 Result: NEGATIVE

## 2017-09-17 ENCOUNTER — Encounter (INDEPENDENT_AMBULATORY_CARE_PROVIDER_SITE_OTHER): Payer: Self-pay | Admitting: Family Medicine

## 2017-10-17 ENCOUNTER — Telehealth (INDEPENDENT_AMBULATORY_CARE_PROVIDER_SITE_OTHER): Payer: Self-pay | Admitting: Family Medicine

## 2017-10-22 NOTE — Telephone Encounter (Signed)
Patient daughter calling back regarding referral for [facility].  Reports [facility] did not accept referral previously and patient needed to change PCP in insurance to Dr. Rhona Leavenshiu.  Reports this has been done but still unable to schedule [facility] as they need an updated referral.

## 2017-10-22 NOTE — Telephone Encounter (Signed)
Updated referral ENT and Neurology referrals  to reflect PCP with [facility].  Faxed again.  Notified daughter.

## 2017-11-09 ENCOUNTER — Inpatient Hospital Stay: Payer: Self-pay

## 2017-11-14 ENCOUNTER — Telehealth: Payer: Self-pay | Admitting: Family Medicine
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
## 2017-12-04 ENCOUNTER — Telehealth (INDEPENDENT_AMBULATORY_CARE_PROVIDER_SITE_OTHER): Payer: Self-pay | Admitting: Internal Medicine

## 2017-12-04 NOTE — Telephone Encounter (Signed)
(  TEXTING IS AN OPTION FOR [facility] CLINICS ONLY)  Is this a [facility] clinic? Yes. What is the mobile number we can use to get a hold of you via text? [phone]      RETURN CALL: Detailed message on voicemail only      SUBJECT:  Form/Letter/Paperwork Request     REASON FOR REQUEST: [employer]     FORM/LETTER/PAPERWORK IS FOR: Other: [employer]  WHO SHOULD FILL IT OUT?: Any provider at clinic  WILL PATIENT PICK UP?: Patient needs an email address so they can email the paperwork that the doctor needs to fill out. Tiffany's email: [email or web address]. Tiffany denied the option to fax because she does not have a fax machine.  NEEDED BY: As soon as possible  ADDITIONAL INFORMATION: Patient needs a note that states [facility].

## 2017-12-05 NOTE — Telephone Encounter (Addendum)
Called and spoke with patients daughter. Let her know since he was last seen in December by Dr.Chiu we would need to make an apt . Patient is scheduled on 3/21 with [employer] for forms. Closing TE

## 2017-12-11 ENCOUNTER — Encounter (INDEPENDENT_AMBULATORY_CARE_PROVIDER_SITE_OTHER): Payer: No Typology Code available for payment source | Admitting: Adult Health

## 2017-12-18 ENCOUNTER — Encounter (INDEPENDENT_AMBULATORY_CARE_PROVIDER_SITE_OTHER): Payer: No Typology Code available for payment source | Admitting: Family

## 2018-02-09 ENCOUNTER — Other Ambulatory Visit (INDEPENDENT_AMBULATORY_CARE_PROVIDER_SITE_OTHER): Payer: Self-pay | Admitting: Family Medicine

## 2018-02-09 DIAGNOSIS — R569 Unspecified convulsions: Secondary | ICD-10-CM

## 2018-02-10 MED ORDER — LAMOTRIGINE 200 MG OR TABS
200.0000 mg | ORAL_TABLET | Freq: Two times a day (BID) | ORAL | 0 refills | Status: DC
Start: 2018-02-10 — End: 2018-05-14

## 2018-05-12 ENCOUNTER — Other Ambulatory Visit (INDEPENDENT_AMBULATORY_CARE_PROVIDER_SITE_OTHER): Payer: Self-pay | Admitting: Family Medicine

## 2018-05-12 DIAGNOSIS — R569 Unspecified convulsions: Secondary | ICD-10-CM

## 2018-05-13 ENCOUNTER — Telehealth (INDEPENDENT_AMBULATORY_CARE_PROVIDER_SITE_OTHER): Payer: Self-pay | Admitting: Family Medicine

## 2018-05-13 NOTE — Telephone Encounter (Signed)
PSR- Please assist patient in establishing care with new PCP.

## 2018-05-13 NOTE — Telephone Encounter (Signed)
We are contacting you because the provider, Nelson Jonae Renshaw , is no longer at the clinic and patient has not established care with a new PCP.  This falls outside of the Refill Authorization Center's protocols.  Please have you or your staff inform the patient and schedule an appointment if necessary.

## 2018-05-13 NOTE — Telephone Encounter (Signed)
Me          05/13/18 3:28 PM   Note      Daughter called in requesting refill of medication. Informed patient would need to establish care with new provider since he has not been seen in over 8 months. Daughter states she would like to establish care at [facility] [place] [facility] and given phone number to call and schedule.

## 2018-05-13 NOTE — Telephone Encounter (Signed)
Daughter called in requesting refill of medication. Informed patient would need to establish care with new provider since he has not been seen in over 8 months. Daughter states she would like to establish care at [facility] [place] [facility] and given phone number to call and schedule.

## 2018-05-14 ENCOUNTER — Ambulatory Visit (INDEPENDENT_AMBULATORY_CARE_PROVIDER_SITE_OTHER): Payer: No Typology Code available for payment source | Admitting: Family Medicine

## 2018-05-14 ENCOUNTER — Encounter (INDEPENDENT_AMBULATORY_CARE_PROVIDER_SITE_OTHER): Payer: Self-pay | Admitting: Family Medicine

## 2018-05-14 ENCOUNTER — Telehealth (INDEPENDENT_AMBULATORY_CARE_PROVIDER_SITE_OTHER): Payer: Self-pay | Admitting: Family Medicine

## 2018-05-14 VITALS — BP 128/85 | HR 58 | Temp 98.1°F | Wt 218.0 lb

## 2018-05-14 DIAGNOSIS — Z1211 Encounter for screening for malignant neoplasm of colon: Secondary | ICD-10-CM

## 2018-05-14 DIAGNOSIS — Z6830 Body mass index (BMI) 30.0-30.9, adult: Secondary | ICD-10-CM

## 2018-05-14 DIAGNOSIS — D649 Anemia, unspecified: Secondary | ICD-10-CM

## 2018-05-14 DIAGNOSIS — R531 Weakness: Secondary | ICD-10-CM

## 2018-05-14 DIAGNOSIS — R569 Unspecified convulsions: Secondary | ICD-10-CM

## 2018-05-14 LAB — CBC (HEMOGRAM)
Hematocrit: 45 % (ref 38–50)
Hemoglobin: 14.2 g/dL (ref 13.0–18.0)
MCH: 28.7 pg (ref 27.3–33.6)
MCHC: 31.8 g/dL — ABNORMAL LOW (ref 32.2–36.5)
MCV: 90 fL (ref 81–98)
Platelet Count: 139 10*3/uL — ABNORMAL LOW (ref 150–400)
RBC: 4.94 10*6/uL (ref 4.40–5.60)
RDW-CV: 13.1 % (ref 11.6–14.4)
WBC: 4.28 10*3/uL — ABNORMAL LOW (ref 4.3–10.0)

## 2018-05-14 LAB — COMPREHENSIVE METABOLIC PANEL
ALT (GPT): 19 U/L (ref 10–48)
AST (GOT): 16 U/L (ref 9–38)
Albumin: 4.4 g/dL (ref 3.5–5.2)
Alkaline Phosphatase (Total): 70 U/L (ref 39–139)
Anion Gap: 5 (ref 4–12)
Bilirubin (Total): 0.6 mg/dL (ref 0.2–1.3)
Calcium: 9.9 mg/dL (ref 8.9–10.2)
Carbon Dioxide, Total: 34 meq/L — ABNORMAL HIGH (ref 22–32)
Chloride: 105 meq/L (ref 98–108)
Creatinine: 0.93 mg/dL (ref 0.51–1.18)
GFR, Calc, African American: >60 mL/min/{1.73_m2} (ref >59)
GFR, Calc, European American: >60 mL/min/{1.73_m2} (ref >59)
Glucose: 79 mg/dL (ref 62–125)
Potassium: 4.5 meq/L (ref 3.6–5.2)
Protein (Total): 6.7 g/dL (ref 6.0–8.2)
Sodium: 144 meq/L (ref 135–145)
Urea Nitrogen: 13 mg/dL (ref 8–21)

## 2018-05-14 MED ORDER — LAMOTRIGINE 200 MG OR TABS
200.0000 mg | ORAL_TABLET | Freq: Two times a day (BID) | ORAL | 0 refills | Status: DC
Start: 2018-05-14 — End: 2018-08-06

## 2018-05-14 NOTE — Progress Notes (Signed)
Reason for visit: Pt is here for follow up on lamotrigine. Patient is requesting potassium labs, states he has been getting charley horses at night occasionally for the last 3 week.   Would [facility] due to weakness in right leg.     Have you seen a specialist since your last visit: YES      neurology    05/14/18  Cammy CopaWAIIS / [employer] has been reviewed/entered in Epic: {y/n:YES  HM DUE : YES (zoster)  Future recommended date vaccine(s):YES ( zoster  PHQ:not due  Fredna Dowhristina Orien Mayhall, MA, CMA    Health Maintenance   Topic Date Due   . Zoster Vaccine (1 of 2) 03/04/2017   . Influenza Vaccine (1) 06/23/2018   . Depression Screening (PHQ-2)  08/29/2018   . Colorectal Cancer Screening (FOBT/FIT)  09/12/2018   . Diabetes Screening  11/11/2020   . Lipid Disorders Screening  08/29/2022   . Tetanus Vaccine  12/16/2024   . HIV Screening  Completed   . Pneumococcal Vaccine: Pediatrics (0-5 years) and At-Risk Patients (6-64 years)  Aged Out       No future appointments.
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
## 2018-05-14 NOTE — Telephone Encounter (Signed)
Daughter reports PT referral needs to go to:    [facility]      [address]   [place], [place] [address]  Phone: [phone]   Fax: [phone]    Referral rightfaxed to number above.

## 2018-05-15 ENCOUNTER — Encounter (INDEPENDENT_AMBULATORY_CARE_PROVIDER_SITE_OTHER): Payer: Self-pay | Admitting: Family Medicine
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
## 2018-07-16 ENCOUNTER — Encounter (INDEPENDENT_AMBULATORY_CARE_PROVIDER_SITE_OTHER): Payer: Self-pay

## 2018-08-06 ENCOUNTER — Other Ambulatory Visit (INDEPENDENT_AMBULATORY_CARE_PROVIDER_SITE_OTHER): Payer: Self-pay | Admitting: Nurse Practitioner

## 2018-08-06 DIAGNOSIS — R569 Unspecified convulsions: Secondary | ICD-10-CM

## 2018-08-07 MED ORDER — LAMOTRIGINE 200 MG OR TABS
ORAL_TABLET | ORAL | 0 refills | Status: DC
Start: 2018-08-07 — End: 2018-10-30

## 2018-08-07 NOTE — Telephone Encounter (Signed)
Patient informed of authorized refill, declined follow up appt. Closing TE.

## 2018-08-07 NOTE — Telephone Encounter (Signed)
Patient is due for yearly exam.  One refill authorized.  Please schedule follow up visit.

## 2018-08-15 ENCOUNTER — Other Ambulatory Visit: Payer: Self-pay

## 2018-08-16 ENCOUNTER — Inpatient Hospital Stay: Payer: Self-pay

## 2018-10-29 ENCOUNTER — Other Ambulatory Visit (INDEPENDENT_AMBULATORY_CARE_PROVIDER_SITE_OTHER): Payer: Self-pay | Admitting: Nurse Practitioner

## 2018-10-29 DIAGNOSIS — R569 Unspecified convulsions: Secondary | ICD-10-CM

## 2018-10-30 MED ORDER — LAMOTRIGINE 200 MG OR TABS
ORAL_TABLET | ORAL | 1 refills | Status: DC
Start: 2018-10-30 — End: 2019-05-21

## 2019-05-19 ENCOUNTER — Other Ambulatory Visit (INDEPENDENT_AMBULATORY_CARE_PROVIDER_SITE_OTHER): Payer: Self-pay | Admitting: Family Medicine

## 2019-05-19 DIAGNOSIS — R569 Unspecified convulsions: Secondary | ICD-10-CM

## 2019-05-21 MED ORDER — LAMOTRIGINE 200 MG OR TABS
ORAL_TABLET | ORAL | 0 refills | Status: DC
Start: 2019-05-21 — End: 2019-09-03

## 2019-05-21 NOTE — Telephone Encounter (Signed)
Patient is due for yearly exam.  One refill authorized.  Please schedule follow up visit.

## 2019-05-21 NOTE — Telephone Encounter (Signed)
Spoke with daughter and relayed message below. She will call back to schedule apt       Thank you

## 2019-07-12 ENCOUNTER — Other Ambulatory Visit: Payer: Self-pay

## 2019-09-03 ENCOUNTER — Other Ambulatory Visit (INDEPENDENT_AMBULATORY_CARE_PROVIDER_SITE_OTHER): Payer: Self-pay | Admitting: Family Medicine

## 2019-09-03 ENCOUNTER — Telehealth: Payer: No Typology Code available for payment source | Admitting: Physician Assistant

## 2019-09-03 DIAGNOSIS — R569 Unspecified convulsions: Secondary | ICD-10-CM

## 2019-09-03 MED ORDER — LAMOTRIGINE 200 MG OR TABS
200.0000 mg | ORAL_TABLET | Freq: Two times a day (BID) | ORAL | 0 refills | Status: DC
Start: 2019-09-03 — End: 2019-11-16
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
## 2019-09-03 NOTE — Patient Instructions (Signed)
(  R56.9) Seizure ([place])  Plan: lamoTRIgine 200 MG tablet          Seizure history secondary to traumatic injury. Denies seizure activity since starting lamotrigine 200 mg twice daily. He is past due for follow-up and labs and has schedule for in clinic wellness at [facility] 09/2019.    Medication refilled for 3 months. Encouraged to keep scheduled appointment.

## 2019-09-27 ENCOUNTER — Encounter (INDEPENDENT_AMBULATORY_CARE_PROVIDER_SITE_OTHER): Payer: No Typology Code available for payment source | Admitting: Physician Assistant

## 2019-09-27 NOTE — Progress Notes (Deleted)
Active [facility]are: YES    Preventative Health Care Updates     When was last:    Colon Screen:  Location/date. Initial GI consult done 07/16/2018.    Hep C screen-    Zoster-    PHQ2-    Influenza-      HM Due:   Health Maintenance   Topic Date Due   . Hepatitis C Screening  Nov 19, 1965   . Zoster Vaccine (1 of 2) 03/04/2017   . Colorectal Cancer Screening  09/12/2018   . Depression Screening (PHQ-2)  11/13/2018   . Influenza Vaccine (1) 06/24/2019   . Diabetes Screening  05/14/2021   . Lipid Disorders Screening  08/29/2022   . DTaP, Tdap, and Td Vaccines (2 - Td) 12/16/2024   . HIV Screening  Completed   . Hepatitis A Vaccine  Aged Out   . Hepatitis B Vaccine  Aged Out   . Pneumococcal Vaccine: Pediatrics (0-5 years) and At-Risk Patients (6-64 years)  Aged Out       05/14/18  WAIIS / [facility] has been reviewed/entered in Epic: {y/n:YES  HM DUE : YES (zoster)  Future recommended date vaccine(s):YES ( zoster  PHQ:not due  Fredna Dow, MA, CMA    No future appointments.

## 2019-10-02 NOTE — Progress Notes (Signed)
This encounter was opened in error.

## 2019-10-14 ENCOUNTER — Inpatient Hospital Stay: Payer: Self-pay

## 2019-10-22 ENCOUNTER — Telehealth (INDEPENDENT_AMBULATORY_CARE_PROVIDER_SITE_OTHER): Payer: Self-pay

## 2019-10-22 NOTE — Telephone Encounter (Signed)
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   Appointment Date:  tbd    Referrals, home health or community services planned on discharge:  NO.    Services received or scheduled:  N/A    Medications on discharge from hospital:    hydrocodone-acetaminophen (NORCO) 5-325 mg Tab  Take 1 Tab by mouth every 6 hours as needed for moderate pain or severe pain (For Pain Scale 4-6).    lamotrigine (LAMICTAL) 200 MG Tab  Take 200 mg by mouth twice daily.    multivitamin (MULTI-DELYN) Liquid  Take 10 mL by mouth each morning.         Routing to TCM pool for f/u call  Janey Genta, RN  Care Manager  [facility]

## 2019-10-25 NOTE — Telephone Encounter (Signed)
TCM Note. Unable to reach patient via phone x 2. eCare message sent.

## 2019-11-16 ENCOUNTER — Other Ambulatory Visit (INDEPENDENT_AMBULATORY_CARE_PROVIDER_SITE_OTHER): Payer: Self-pay | Admitting: Physician Assistant

## 2019-11-16 ENCOUNTER — Telehealth (INDEPENDENT_AMBULATORY_CARE_PROVIDER_SITE_OTHER): Payer: Self-pay | Admitting: Physician Assistant

## 2019-11-16 DIAGNOSIS — R569 Unspecified convulsions: Secondary | ICD-10-CM

## 2019-11-16 MED ORDER — LAMOTRIGINE 200 MG OR TABS
200.0000 mg | ORAL_TABLET | Freq: Two times a day (BID) | ORAL | 0 refills | Status: AC
Start: 2019-11-16 — End: ?

## 2019-11-16 NOTE — Telephone Encounter (Signed)
Medication Refill     Is this a controlled substance:No  Do they have a controlled substance agreement:   No  Was pt seen in the last 3 months No: Needs Routine follow up- Routing to FD to assist in scheduling     Last written 09/03/2019  Number of refills 0    Last Labs:    Other: N/A    Rx and pharmacy pended.    Routing to provider to relay instructions for next step. Please advised on appointment type if indicated (in person ov, telemed, or phone visit).

## 2019-11-16 NOTE — Telephone Encounter (Signed)
RETURN CALL: Voicemail - General Message      SUBJECT:  Refill Request    NAME OF MEDICATION(S): lamoTRIgine 200 MG tablet   DATE NEEDED BY: ASAP  PRESCRIBING PROVIDER: Knox Saliva  PHARMACY NAME/LOCATION:   [id number] [id number] [place] [phone] [id number]   ADDITIONAL INFORMATION:   PATIENT ONLY HAS TWO DAYS LEFT, PATIENTS DAUGHTER Cody Mccormick WOULD LIKE CALL BACK ONCE THIS HAS BEEN SENT. THANK YOU

## 2020-02-01 ENCOUNTER — Emergency Department: Payer: Self-pay

## 2020-06-29 ENCOUNTER — Emergency Department: Payer: Self-pay

## 2020-07-12 ENCOUNTER — Other Ambulatory Visit: Payer: Self-pay

## 2020-09-14 ENCOUNTER — Emergency Department: Payer: Self-pay

## 2020-11-03 ENCOUNTER — Emergency Department: Payer: Self-pay

## 2020-11-10 ENCOUNTER — Emergency Department: Payer: Self-pay

## 2020-11-16 ENCOUNTER — Telehealth (INDEPENDENT_AMBULATORY_CARE_PROVIDER_SITE_OTHER): Payer: Self-pay | Admitting: Physician Assistant

## 2020-11-16 NOTE — Telephone Encounter (Signed)
Fax received from [facility]/ Adult [facility]ctive Services for patient.     Form placed in provider's box for completion.

## 2020-11-24 NOTE — Telephone Encounter (Signed)
Completed [facility]; updated PCP as he is not under my care. One visit 2020 telemedicine for medication request. NO follow-up scheduled at [facility] or updated labs as discussed.

## 2021-03-08 ENCOUNTER — Emergency Department: Payer: Self-pay

## 2021-03-14 ENCOUNTER — Emergency Department: Payer: Self-pay
